# Patient Record
Sex: Female | Born: 1970 | Hispanic: Yes | Marital: Married | Smoking: Never smoker
Health system: Southern US, Community
[De-identification: ages and names within clinical notes are randomized; demographics above are authoritative.]

## PROBLEM LIST (undated history)

## (undated) DIAGNOSIS — Z227 Latent tuberculosis: Secondary | ICD-10-CM

## (undated) DIAGNOSIS — K5090 Crohn's disease, unspecified, without complications: Secondary | ICD-10-CM

## (undated) HISTORY — PX: COLONOSCOPY: SHX174

## (undated) HISTORY — DX: Crohn's disease, unspecified, without complications: K50.90

## (undated) HISTORY — DX: Latent tuberculosis: Z22.7

---
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
## 2020-03-06 ENCOUNTER — Other Ambulatory Visit: Payer: Self-pay

## 2020-03-06 ENCOUNTER — Encounter: Payer: Self-pay | Admitting: Emergency Medicine

## 2020-03-06 ENCOUNTER — Encounter: Payer: Self-pay | Admitting: Gastroenterology

## 2020-03-06 ENCOUNTER — Ambulatory Visit (INDEPENDENT_AMBULATORY_CARE_PROVIDER_SITE_OTHER): Payer: 59 | Admitting: Emergency Medicine

## 2020-03-06 VITALS — BP 119/79 | HR 77 | Temp 98.4°F | Resp 16 | Ht 65.0 in | Wt 136.0 lb

## 2020-03-06 DIAGNOSIS — Z23 Encounter for immunization: Secondary | ICD-10-CM | POA: Diagnosis not present

## 2020-03-06 DIAGNOSIS — Z7689 Persons encountering health services in other specified circumstances: Secondary | ICD-10-CM

## 2020-03-06 DIAGNOSIS — K839 Disease of biliary tract, unspecified: Secondary | ICD-10-CM | POA: Diagnosis not present

## 2020-03-06 NOTE — Patient Instructions (Addendum)
   If you have lab work done today you will be contacted with your lab results within the next 2 weeks.  If you have not heard from us then please contact us. The fastest way to get your results is to register for My Chart.   IF you received an x-ray today, you will receive an invoice from [facility]. Please contact [facility] at [phone] with questions or concerns regarding your invoice.   IF you received labwork today, you will receive an invoice from [facility]. Please contact [facility] at [phone] with questions or concerns regarding your invoice.   Our billing staff will not be able to assist you with questions regarding bills from these companies.  You will be contacted with the lab results as soon as they are available. The fastest way to get your results is to activate your My Chart account. Instructions are located on the last page of this paperwork. If you have not heard from us regarding the results in 2 weeks, please contact this office.     Mantenimiento de la salud en las mujeres Health Maintenance, Female Adoptar un estilo de vida saludable y recibir atencin preventiva son importantes para promover la salud y el bienestar. Consulte al mdico sobre:  El esquema adecuado para hacerse pruebas y exmenes peridicos.  Cosas que puede hacer por su cuenta para prevenir enfermedades y mantenerse sana. Qu debo saber sobre la dieta, el peso y el ejercicio? Consuma una dieta saludable   Consuma una dieta que incluya muchas verduras, frutas, productos lcteos con bajo contenido de grasa y protenas magras.  No consuma muchos alimentos ricos en grasas slidas, azcares agregados o sodio. Mantenga un peso saludable El ndice de masa muscular (IMC) se utiliza para identificar problemas de peso. Proporciona una estimacin de la grasa corporal basndose en el peso y la altura. Su mdico puede ayudarle a determinar su IMC y a lograr o mantener un peso  saludable. Haga ejercicio con regularidad Haga ejercicio con regularidad. Esta es una de las prcticas ms importantes que puede hacer por su salud. La mayora de los adultos deben seguir estas pautas:  Realizar, al menos, 150minutos de actividad fsica por semana. El ejercicio debe aumentar la frecuencia cardaca y hacerlo transpirar (ejercicio de intensidad moderada).  Hacer ejercicios de fortalecimiento por lo menos dos veces por semana. Agregue esto a su plan de ejercicio de intensidad moderada.  Pasar menos tiempo sentados. Incluso la actividad fsica ligera puede ser beneficiosa. Controle sus niveles de colesterol y lpidos en la sangre Comience a realizarse anlisis de lpidos y colesterol en la sangre a los 20aos y luego reptalos cada 5aos. Hgase controlar los niveles de colesterol con mayor frecuencia si:  Sus niveles de lpidos y colesterol son altos.  Es mayor de 40aos.  Presenta un alto riesgo de padecer enfermedades cardacas. Qu debo saber sobre las pruebas de deteccin del cncer? Segn su historia clnica y sus antecedentes familiares, es posible que deba realizarse pruebas de deteccin del cncer en diferentes edades. Esto puede incluir pruebas de deteccin de lo siguiente:  Cncer de mama.  Cncer de cuello uterino.  Cncer colorrectal.  Cncer de piel.  Cncer de pulmn. Qu debo saber sobre la enfermedad cardaca, la diabetes y la hipertensin arterial? Presin arterial y enfermedad cardaca  La hipertensin arterial causa enfermedades cardacas y aumenta el riesgo de accidente cerebrovascular. Es ms probable que esto se manifieste en las personas que tienen lecturas de presin arterial alta, tienen ascendencia africana o   tienen sobrepeso.  Hgase controlar la presin arterial: ? Cada 3 a 5 aos si tiene entre 18 y 39 aos. ? Todos los aos si es mayor de 40aos. Diabetes Realcese exmenes de deteccin de la diabetes con regularidad. Este  anlisis revisa el nivel de azcar en la sangre en ayunas. Hgase las pruebas de deteccin:  Cada tresaos despus de los 40aos de edad si tiene un peso normal y un bajo riesgo de padecer diabetes.  Con ms frecuencia y a partir de una edad inferior si tiene sobrepeso o un alto riesgo de padecer diabetes. Qu debo saber sobre la prevencin de infecciones? Hepatitis B Si tiene un riesgo ms alto de contraer hepatitis B, debe someterse a un examen de deteccin de este virus. Hable con el mdico para averiguar si tiene riesgo de contraer la infeccin por hepatitis B. Hepatitis C Se recomienda el anlisis a:  Todos los que nacieron entre 1945 y 1965.  Todas las personas que tengan un riesgo de haber contrado hepatitis C. Enfermedades de transmisin sexual (ETS)  Hgase las pruebas de deteccin de ITS, incluidas la gonorrea y la clamidia, si: ? Es sexualmente activa y es menor de 24aos. ? Es mayor de 24aos, y el mdico le informa que corre riesgo de tener este tipo de infecciones. ? La actividad sexual ha cambiado desde que le hicieron la ltima prueba de deteccin y tiene un riesgo mayor de tener clamidia o gonorrea. Pregntele al mdico si usted tiene riesgo.  Pregntele al mdico si usted tiene un alto riesgo de contraer VIH. El mdico tambin puede recomendarle un medicamento recetado para ayudar a evitar la infeccin por el VIH. Si elige tomar medicamentos para prevenir el VIH, primero debe hacerse los anlisis de deteccin del VIH. Luego debe hacerse anlisis cada 3meses mientras est tomando los medicamentos. Embarazo  Si est por dejar de menstruar (fase premenopusica) y usted puede quedar embarazada, busque asesoramiento antes de quedar embarazada.  Tome de 400 a 800microgramos (mcg) de cido flico todos los das si queda embarazada.  Pida mtodos de control de la natalidad (anticonceptivos) si desea evitar un embarazo no deseado. Osteoporosis y menopausia La  osteoporosis es una enfermedad en la que los huesos pierden los minerales y la fuerza por el avance de la edad. El resultado pueden ser fracturas en los huesos. Si tiene 65aos o ms, o si est en riesgo de sufrir osteoporosis y fracturas, pregunte a su mdico si debe:  Hacerse pruebas de deteccin de prdida sea.  Tomar un suplemento de calcio o de vitamina D para reducir el riesgo de fracturas.  Recibir terapia de reemplazo hormonal (TRH) para tratar los sntomas de la menopausia. Siga estas instrucciones en su casa: Estilo de vida  No consuma ningn producto que contenga nicotina o tabaco, como cigarrillos, cigarrillos electrnicos y tabaco de mascar. Si necesita ayuda para dejar de fumar, consulte al mdico.  No consuma drogas.  No comparta agujas.  Solicite ayuda a su mdico si necesita apoyo o informacin para abandonar las drogas. Consumo de alcohol  No beba alcohol si: ? Su mdico le indica no hacerlo. ? Est embarazada, puede estar embarazada o est tratando de quedar embarazada.  Si bebe alcohol: ? Limite la cantidad que consume de 0 a 1 medida por da. ? Limite la ingesta si est amamantando.  Est atento a la cantidad de alcohol que hay en las bebidas que toma. En los [place], una medida equivale a una botella de cerveza de 12oz (355ml),   un vaso de vino de 5oz (148ml) o un vaso de una bebida alcohlica de alta graduacin de 1oz (44ml). Instrucciones generales  Realcese los estudios de rutina de la salud, dentales y de la vista.  Mantngase al da con las vacunas.  Infrmele a su mdico si: ? Se siente deprimida con frecuencia. ? Alguna vez ha sido vctima de maltrato o no se siente segura en su casa. Resumen  Adoptar un estilo de vida saludable y recibir atencin preventiva son importantes para promover la salud y el bienestar.  Siga las instrucciones del mdico acerca de una dieta saludable, el ejercicio y la realizacin de pruebas o exmenes  para detectar enfermedades.  Siga las instrucciones del mdico con respecto al control del colesterol y la presin arterial. Esta informacin no tiene como fin reemplazar el consejo del mdico. Asegrese de hacerle al mdico cualquier pregunta que tenga. Document Revised: 09/20/2018 Document Reviewed: 09/20/2018 Elsevier Patient Education  2020 Elsevier Inc.  

## 2020-03-06 NOTE — Progress Notes (Signed)
Mackenzie Floyd 49 y.o.   Chief Complaint  Patient presents with  . Establish Care    per patient to discuss CT scan of abd she had results, per patient    HISTORY OF PRESENT ILLNESS: This is a 49 y.o. female first visit to this office here to establish care with me. CT scan of abdomen and pelvis dated 10/03/2018 done at [facility] in [facility] showed intrahepatic and extrahepatic biliary dilation.  Patient had been complaining of burning sensation to right upper quadrant along with weight loss.  Was seen once by gastrointestinal doctor who recommended ERCP.  No follow-up visit was scheduled due to pandemic.  Patient new to this area.  Needs GI referral. Gets occasional burning sensation to right upper quadrant, otherwise asymptomatic. Past medical history includes positive ANA and low platelets.  Has never been diagnosed with lupus or any other condition. Does not want to do blood work today. Fully vaccinated against Covid.  HPI   Prior to Admission medications   Medication Sig Start Date End Date Taking? Authorizing Provider  VITAMIN D PO Take 2,000 mg by mouth daily.   Yes [provider]  Magnesium Hydroxide (MAGNESIA PO) Take by mouth daily.    [provider]    No Known Allergies  There are no problems to display for this patient.   History reviewed. No pertinent past medical history.  Past Surgical History:  Procedure Laterality Date  . CESAREAN SECTION N/A    Phreesia 03/04/2020    Social History   Socioeconomic History  . Marital status: Married    Spouse name: Not on file  . Number of children: Not on file  . Years of education: Not on file  . Highest education level: Not on file  Occupational History  . Not on file  Tobacco Use  . Smoking status: Never Smoker  . Smokeless tobacco: Never Used  Substance and Sexual Activity  . Alcohol use: Not on file    Comment: socially beer and wine  . Drug use: Never  .  Sexual activity: Not on file  Other Topics Concern  . Not on file  Social History Narrative  . Not on file   Social Determinants of Health   Financial Resource Strain:   . Difficulty of Paying Living Expenses:   Food Insecurity:   . Worried About Charity fundraiser in the Last Year:   . Arboriculturist in the Last Year:   Transportation Needs:   . Film/video editor (Medical):   Marland Kitchen Lack of Transportation (Non-Medical):   Physical Activity:   . Days of Exercise per Week:   . Minutes of Exercise per Session:   Stress:   . Feeling of Stress :   Social Connections:   . Frequency of Communication with Friends and Family:   . Frequency of Social Gatherings with Friends and Family:   . Attends Religious Services:   . Active Member of Clubs or Organizations:   . Attends Archivist Meetings:   Marland Kitchen Marital Status:   Intimate Partner Violence:   . Fear of Current or Ex-Partner:   . Emotionally Abused:   Marland Kitchen Physically Abused:   . Sexually Abused:     History reviewed. No pertinent family history.   Review of Systems  Constitutional: Negative.  Negative for chills and fever.  HENT: Negative.  Negative for congestion and sore throat.   Respiratory: Negative.  Negative for cough and shortness of breath.  Cardiovascular: Negative.  Negative for chest pain and palpitations.  Gastrointestinal: Negative.  Negative for abdominal pain, blood in stool, constipation, diarrhea, melena, nausea and vomiting.  Genitourinary: Negative.  Negative for dysuria and hematuria.  Musculoskeletal: Negative.  Negative for back pain, myalgias and neck pain.  Skin: Negative.  Negative for rash.  Neurological: Negative.  Negative for dizziness and headaches.  Endo/Heme/Allergies: Negative.   All other systems reviewed and are negative.  Vitals:   03/06/20 0918  BP: 119/79  Pulse: 77  Resp: 16  Temp: 98.4 F (36.9 C)  SpO2: 100%     Physical Exam Vitals reviewed.  Constitutional:        Appearance: Normal appearance.  HENT:     Head: Normocephalic.  Eyes:     Extraocular Movements: Extraocular movements intact.  Cardiovascular:     Rate and Rhythm: Normal rate.  Pulmonary:     Effort: Pulmonary effort is normal.  Musculoskeletal:        General: Normal range of motion.     Cervical back: Normal range of motion.  Neurological:     General: No focal deficit present.     Mental Status: She is alert and oriented to person, place, and time.  Psychiatric:        Mood and Affect: Mood normal.        Behavior: Behavior normal.      ASSESSMENT & PLAN: Mackenzie Floyd was seen today for establish care.  Diagnoses and all orders for this visit:  Encounter to establish care  Biliary tract disease -     Ambulatory referral to Gastroenterology  Need for diphtheria-tetanus-pertussis (Tdap) vaccine -     Tdap vaccine greater than or equal to 7yo IM    Patient Instructions       If you have lab work done today you will be contacted with your lab results within the next 2 weeks.  If you have not heard from [place] then please contact us. The fastest way to get your results is to register for My Chart.   IF you received an x-ray today, you will receive an invoice from [facility]. Please contact [facility] at [phone] with questions or concerns regarding your invoice.   IF you received labwork today, you will receive an invoice from [facility]. Please contact [facility] at [phone] with questions or concerns regarding your invoice.   Our billing staff will not be able to assist you with questions regarding bills from these companies.  You will be contacted with the lab results as soon as they are available. The fastest way to get your results is to activate your My Chart account. Instructions are located on the last page of this paperwork. If you have not heard from [place] regarding the results in 2 weeks, please contact this office.     Mantenimiento de  Technical sales engineer en [place] Maintenance, Female Adoptar un estilo de vida saludable y recibir atencin preventiva son importantes para promover la salud y Musician. Consulte al mdico sobre:  El esquema adecuado para hacerse pruebas y exmenes peridicos.  Cosas que puede hacer por su cuenta para prevenir enfermedades y [employer]. Qu debo saber sobre la dieta, el peso y el ejercicio? Consuma una dieta saludable   Consuma una dieta que incluya muchas verduras, frutas, productos lcteos con bajo contenido de [place] y Advertising account planner.  No consuma muchos alimentos ricos en grasas slidas, azcares agregados o sodio. Mantenga un peso saludable El ndice de masa  muscular [facility]) se [place] para identificar problemas de peso. Proporciona una estimacin de la grasa corporal basndose en el peso y la altura. Su mdico puede ayudarle a Radiation protection practitioner [place] y a Scientist, forensic o Theatre manager un peso saludable. Haga ejercicio con regularidad Haga ejercicio con regularidad. Esta es una de las prcticas ms importantes que puede hacer por su salud. La mayora de los adultos deben seguir estas pautas:  Optometrist, al menos, 131minutos de actividad fsica por semana. El ejercicio debe aumentar la frecuencia cardaca y Nature conservation officer transpirar (ejercicio de intensidad moderada).  Hacer ejercicios de fortalecimiento por lo [employer] por semana. Agregue esto a su plan de ejercicio de intensidad moderada.  Pasar menos tiempo sentados. Incluso la actividad fsica ligera puede ser beneficiosa. Controle sus niveles de colesterol y lpidos en la sangre Comience a realizarse anlisis de lpidos y Research officer, trade union en la sangre a los 20aos y luego reptalos cada 5aos. Hgase controlar los niveles de colesterol con mayor frecuencia si:  Sus niveles de lpidos y colesterol son altos.  Es mayor de 40aos.  Presenta un alto riesgo de padecer enfermedades cardacas. Qu debo saber sobre las pruebas de deteccin del  cncer? Segn su historia clnica y sus antecedentes familiares, es posible que deba realizarse pruebas de deteccin del cncer en diferentes edades. Esto puede incluir pruebas de deteccin de lo siguiente:  Cncer de mama.  Cncer de cuello uterino.  Cncer colorrectal.  Cncer de piel.  Cncer de pulmn. Qu debo saber sobre la enfermedad cardaca, la diabetes y la hipertensin arterial? Presin arterial y enfermedad cardaca  La hipertensin arterial causa enfermedades cardacas y [place] el riesgo de accidente cerebrovascular. Es ms probable que esto se manifieste en las personas que tienen lecturas de presin arterial alta, tienen ascendencia africana o tienen sobrepeso.  Hgase controlar la presin arterial: ? Cada 3 a 5 aos si tiene entre 18 y 65 aos. ? Todos los aos si es mayor de [place]. Diabetes Realcese exmenes de deteccin de la diabetes con regularidad. Este anlisis revisa el nivel de azcar en la sangre en [place]. Hgase las pruebas de deteccin:  Cada tresaos despus de los 76aos de edad si tiene un peso normal y un bajo riesgo de padecer diabetes.  Con ms frecuencia y a partir de [place] edad inferior si tiene sobrepeso o un alto riesgo de padecer diabetes. Qu debo saber sobre la prevencin de infecciones? Hepatitis B Si tiene un riesgo ms alto de contraer hepatitis B, debe someterse a un examen de deteccin de este virus. Hable con el mdico para averiguar si tiene riesgo de contraer la infeccin por hepatitis B. Hepatitis C Se recomienda el anlisis a:  [employer] 1945 y 1965.  Todas las personas que tengan un riesgo de haber contrado hepatitis C. Enfermedades de transmisin sexual (ETS)  Hgase las pruebas de Programme researcher, broadcasting/film/video de ITS, incluidas la gonorrea y la clamidia, si: ? Es sexualmente activa y es menor de [place]. ? Es mayor de 24aos, y Investment banker, operational informa que corre riesgo de tener este tipo de infecciones. ? La actividad  sexual ha cambiado desde que le hicieron la ltima prueba de deteccin y tiene un riesgo mayor de Best boy clamidia o Radio broadcast assistant. Pregntele al mdico si usted tiene riesgo.  Pregntele al mdico si usted tiene un alto riesgo de Museum/gallery curator VIH. El mdico tambin puede recomendarle un medicamento recetado para ayudar a evitar la infeccin por el VIH. Si elige tomar medicamentos para prevenir el VIH, primero debe  hacerse los [employer] de deteccin del VIH. Luego debe hacerse anlisis cada 84meses mientras est tomando los medicamentos. Embarazo  Si est por dejar de Librarian, academic (fase premenopusica) y usted puede quedar [place], busque asesoramiento antes de [place].  Tome de 400 a [id number] (mcg) de cido [employer] si [place].  Pida mtodos de control de la natalidad (anticonceptivos) si desea evitar un embarazo no deseado. Osteoporosis y [place] La osteoporosis es una enfermedad en la que los huesos pierden los minerales y la fuerza por el avance de la edad. El resultado pueden ser fracturas en los [place]. Si tiene 65aos o ms, o si est en riesgo de sufrir osteoporosis y fracturas, pregunte a su mdico si debe:  Hacerse pruebas de deteccin de prdida sea.  Tomar un suplemento de calcio o de vitamina D para reducir el riesgo de fracturas.  Recibir terapia de reemplazo hormonal (TRH) para tratar los sntomas de la menopausia. Siga estas instrucciones en su casa: Estilo de vida  No consuma ningn producto que contenga nicotina o tabaco, como cigarrillos, cigarrillos electrnicos y tabaco de Higher education careers adviser. Si necesita ayuda para dejar de fumar, consulte al mdico.  No consuma drogas.  No comparta agujas.  Solicite ayuda a su mdico si necesita apoyo o informacin para abandonar las drogas. Consumo de alcohol  No beba alcohol si: ? Su mdico le indica no hacerlo. ? Est embarazada, puede estar embarazada o est tratando de quedar embarazada.  Si bebe  alcohol: ? Limite la cantidad que consume de 0 a 1 medida por da. ? Limite la ingesta si est amamantando.  Est atento a la cantidad de alcohol que hay en las bebidas que toma. En los [place], una medida equivale a una botella de cerveza de 12oz (372ml), un vaso de vino de 5oz (111ml) o un vaso de una bebida alcohlica de alta graduacin de 1oz (20ml). Instrucciones generales  Realcese los estudios de rutina de la salud, dentales y de Public librarian.  [place].  Infrmele a su mdico si: ? Se siente deprimida con frecuencia. ? Alguna vez ha sido vctima de [place] o no se siente segura en su casa. Resumen  Adoptar un estilo de vida saludable y recibir atencin preventiva son importantes para promover la salud y Musician.  Siga las instrucciones del mdico acerca de una dieta saludable, el ejercicio y la realizacin de pruebas o exmenes para Engineer, building services.  Siga las instrucciones del mdico con respecto al control del colesterol y la presin arterial. Esta informacin no tiene Marine scientist el consejo del mdico. Asegrese de hacerle al mdico cualquier pregunta que tenga. Document Revised: 09/20/2018 Document Reviewed: 09/20/2018 Elsevier Patient Education  2020 Elsevier Inc.      Agustina Caroli, MD [facility]

## 2020-03-07 ENCOUNTER — Encounter: Payer: Self-pay | Admitting: Gastroenterology

## 2020-03-11 ENCOUNTER — Other Ambulatory Visit (INDEPENDENT_AMBULATORY_CARE_PROVIDER_SITE_OTHER): Payer: 59

## 2020-03-11 ENCOUNTER — Telehealth: Payer: Self-pay | Admitting: Gastroenterology

## 2020-03-11 ENCOUNTER — Encounter: Payer: Self-pay | Admitting: Gastroenterology

## 2020-03-11 ENCOUNTER — Other Ambulatory Visit: Payer: Self-pay

## 2020-03-11 ENCOUNTER — Ambulatory Visit (INDEPENDENT_AMBULATORY_CARE_PROVIDER_SITE_OTHER): Payer: 59 | Admitting: Gastroenterology

## 2020-03-11 VITALS — BP 112/78 | HR 68 | Ht 65.0 in | Wt 138.0 lb

## 2020-03-11 DIAGNOSIS — R933 Abnormal findings on diagnostic imaging of other parts of digestive tract: Secondary | ICD-10-CM

## 2020-03-11 DIAGNOSIS — Z1211 Encounter for screening for malignant neoplasm of colon: Secondary | ICD-10-CM | POA: Diagnosis not present

## 2020-03-11 DIAGNOSIS — R1011 Right upper quadrant pain: Secondary | ICD-10-CM

## 2020-03-11 LAB — COMPREHENSIVE METABOLIC PANEL
ALT: 9 U/L (ref 0–35)
AST: 16 U/L (ref 0–37)
Albumin: 4.3 g/dL (ref 3.5–5.2)
Alkaline Phosphatase: 65 U/L (ref 39–117)
BUN: 8 mg/dL (ref 6–23)
CO2: 30 mEq/L (ref 19–32)
Calcium: 9.6 mg/dL (ref 8.4–10.5)
Chloride: 101 mEq/L (ref 96–112)
Creatinine, Ser: 0.81 mg/dL (ref 0.40–1.20)
GFR: 75.19 mL/min (ref >60.00)
Glucose, Bld: 91 mg/dL (ref 70–99)
Potassium: 4 mEq/L (ref 3.5–5.1)
Sodium: 136 mEq/L (ref 135–145)
Total Bilirubin: 0.9 mg/dL (ref 0.2–1.2)
Total Protein: 7.4 g/dL (ref 6.0–8.3)

## 2020-03-11 LAB — CBC WITH DIFFERENTIAL/PLATELET
Basophils Absolute: 0.1 10*3/uL (ref 0.0–0.1)
Basophils Relative: 1.3 % (ref 0.0–3.0)
Eosinophils Absolute: 0.2 10*3/uL (ref 0.0–0.7)
Eosinophils Relative: 3.8 % (ref 0.0–5.0)
HCT: 38.5 % (ref 36.0–46.0)
Hemoglobin: 12.9 g/dL (ref 12.0–15.0)
Lymphocytes Relative: 20.7 % (ref 12.0–46.0)
Lymphs Abs: 1.1 10*3/uL (ref 0.7–4.0)
MCHC: 33.5 g/dL (ref 30.0–36.0)
MCV: 83.4 fl (ref 78.0–100.0)
Monocytes Absolute: 0.4 10*3/uL (ref 0.1–1.0)
Monocytes Relative: 7.5 % (ref 3.0–12.0)
Neutro Abs: 3.6 10*3/uL (ref 1.4–7.7)
Neutrophils Relative %: 66.7 % (ref 43.0–77.0)
Platelets: 141 10*3/uL — ABNORMAL LOW (ref 150.0–400.0)
RBC: 4.61 Mil/uL (ref 3.87–5.11)
RDW: 14.2 % (ref 11.5–15.5)
WBC: 5.4 10*3/uL (ref 4.0–10.5)

## 2020-03-11 LAB — H. PYLORI ANTIBODY, IGG: H Pylori IgG: NEGATIVE

## 2020-03-11 NOTE — Patient Instructions (Signed)
If you are age 49 or older, your body mass index should be between 23-30. Your Body mass index is 22.96 kg/m. If this is out of the aforementioned range listed, please consider follow up with your Primary Care Provider.  If you are age 50 or younger, your body mass index should be between 19-25. Your Body mass index is 22.96 kg/m. If this is out of the aformentioned range listed, please consider follow up with your Primary Care Provider.   You have been scheduled for an MRCP at [facility] through the [facility]. They will direct you to radiology.. Your appointment is scheduled on Tuesday, 03-25-20 at 7:00am. Please arrive 30 minutes prior to your appointment time for registration purposes. Please make certain not to have anything to eat or drink 6 hours prior to your test. In addition, if you have any metal in your body, have a pacemaker or defibrillator, please be sure to let your ordering physician know. This test typically takes 45 minutes to 1 hour to complete. Should you need to reschedule, please call [phone].  _____________________________________________________________________  Please go to the lab in the basement of our building to have lab work done as you leave today. Hit "B" for basement when you get on the elevator.  When the doors open the lab is on your left.  We will call you with the results. Thank you.  Due to recent changes in healthcare laws, you may see the results of your imaging and laboratory studies on MyChart before your provider has had a chance to review them.  We understand that in some cases there may be results that are confusing or concerning to you. Not all laboratory results come back in the same time frame and the provider may be waiting for multiple results in order to interpret others.  Please give [place] 48 hours in order for your provider to thoroughly review all the results before contacting the office for clarification of  your results.    Thank you for entrusting me with your care and for choosing [facility], Dr.  Cellar

## 2020-03-11 NOTE — Progress Notes (Signed)
HPI :  49 year old healthy female without any significant past medical history, referred by Drenda Freeze MD for abdominal discomfort and history of abnormal CT scan.  Patient states back in 2018 she developed some right upper quadrant discomfort when she lived in [place].  She was told potentially she had a gallbladder problem but had an ultrasound that was reportedly okay.  Apparently over time her symptoms came back and she had a CT scan done in January as outlined below.  Remarkable findings are intra and extrahepatic biliary ductal dilation which appears mild, benign cyst in the liver, nondistended gallbladder without gallstones.  She was told she might need a follow-up MRI or ERCP.  She has not followed up with GI since that timeframe.  She has since moved to [place].  She has an occasional burning sensation in her right upper quadrant which does not really radiate anywhere else.  She will feel this to some extent every day, often feels it after she eats something, usually feels it multiple times a day and then will go away after short period of time.  She denies any reflux or dysphagia.  No nausea or vomiting.  She had some problems in January with constipation, has been using some magnesium supplementation and that has resolved this issue.  She denies any blood in her stools.  She denies any significant past medical history.  No family history of liver disease.  Her father had some colon polyps removed in the past but no family history of colon cancer.  She is never had a prior colonoscopy.  No prior upper endoscopy.  It does not appear that she is tried antacid for her upper abdominal discomfort.  She does not drink much of any alcohol.  CT scan 10/03/18 - [facility] in [place] - intrahepatic / extrahepatic ductal dilation - 2.9 x 2.7cm low density lesion in the liver most c/w cyst, gallbladder nondistended, degenerative changes in the SI joint vs. Sacroileitis.     No past medical history on file.   Past Surgical History:  Procedure Laterality Date   CESAREAN SECTION N/A 1997 and 2008   [employer] 03/04/2020   Family History  Problem Relation Age of Onset   Stroke Father    Hypertension Father    Social History   Tobacco Use   Smoking status: Never Smoker   Smokeless tobacco: Never Used  Substance Use Topics   Alcohol use: Yes    Comment: socially beer and wine   Drug use: Never   Current Outpatient Medications  Medication Sig Dispense Refill   Magnesium Hydroxide (MAGNESIA PO) Take by mouth daily.     VITAMIN D PO Take 2,000 mg by mouth daily.     No current facility-administered medications for this visit.   No Known Allergies   Review of Systems: All systems reviewed and negative except where noted in HPI.   No labs on file   Physical Exam: BP 112/78    Pulse 68    Ht 5\' 5"  (1.651 m)    Wt 138 lb (62.6 kg)    LMP 03/04/2020 (Exact Date)    Breastfeeding Unknown    BMI 22.96 kg/m  Constitutional: Pleasant,well-developed,female in no acute distress. HEENT: Normocephalic and atraumatic. Conjunctivae are normal. No scleral icterus. Neck supple.  Cardiovascular: Normal rate, regular rhythm.  Pulmonary/chest: Effort normal and breath sounds normal. No wheezing, rales or rhonchi. Abdominal: Soft, nondistended, nontender. There are no masses palpable. No hepatomegaly. Extremities: no edema Lymphadenopathy:  No cervical adenopathy noted. Neurological: Alert and oriented to person place and time. Skin: Skin is warm and dry. No rashes noted. Psychiatric: Normal mood and affect. Behavior is normal.   ASSESSMENT AND PLAN: 49 year old female here for new patient assessment the following:  Abnormal imaging of the biliary tree / right upper quadrant discomfort - sounds like intermittent symptoms for a long time, CT scan in January showed some nonspecific mild biliary ductal dilation.  I do not have any baseline labs on  file for her.  I discussed differential for her CT imaging abnormalities and her symptoms.  Recommending LFTs today and will also check CBC.  Based on her CT scan findings I am recommending an MRCP to further evaluate her biliary tree.  That being said unclear if her symptoms are related to the biliary ductal dilation or not.  I will screen her for H pylori IgG and offer her a trial of PPI to see if this will help some of her postprandial symptoms.  Apparently prior ultrasound showed no evidence of gallstones.  She wishes to pursue MRCP and labs today, but symptoms are mild and she declines trial of PPI when offered.  She will consider this pending her course.  All questions answered she agreed with the plan.  Colon cancer screening - she is due for routine colon cancer screening.  Discussed colonoscopy and risk benefits of it.  She is willing to proceed with this at some point time but wishes to get her MRI done first and will call to schedule when she wants to proceed with colonoscopy.  [place] Cellar, MD [facility]  CC: Mitchel Honour Elkins

## 2020-03-11 NOTE — Progress Notes (Signed)
error 

## 2020-03-11 NOTE — Telephone Encounter (Signed)
Mackenzie Floyd called to let you know that they do not have the CT scan report it is from [place]. Please call her if you have any further questions.

## 2020-03-11 NOTE — Telephone Encounter (Signed)
CT scan requested and rec'd from [facility] in [place].

## 2020-03-25 ENCOUNTER — Other Ambulatory Visit: Payer: Self-pay | Admitting: Gastroenterology

## 2020-03-25 ENCOUNTER — Ambulatory Visit (HOSPITAL_COMMUNITY)
Admission: RE | Admit: 2020-03-25 | Discharge: 2020-03-25 | Disposition: A | Discharge status: Home / Self Care | Payer: 59 | Source: Ambulatory Visit | Attending: Gastroenterology | Admitting: Gastroenterology

## 2020-03-25 ENCOUNTER — Other Ambulatory Visit: Payer: Self-pay

## 2020-03-25 ENCOUNTER — Telehealth: Payer: Self-pay

## 2020-03-25 DIAGNOSIS — R933 Abnormal findings on diagnostic imaging of other parts of digestive tract: Secondary | ICD-10-CM | POA: Insufficient documentation

## 2020-03-25 DIAGNOSIS — R1011 Right upper quadrant pain: Secondary | ICD-10-CM | POA: Insufficient documentation

## 2020-03-25 IMAGING — MR MR ABDOMEN WO/W CM MRCP
18 of 21 series · 43 of 48 positions shown · IV contrast (gadavist)
Comparison: None.

CLINICAL DATA: Right upper quadrant pain. Outside CT demonstrating
biliary duct dilatation.

EXAM:
MRI ABDOMEN WITHOUT AND WITH CONTRAST (INCLUDING MRCP)
TECHNIQUE: Multiplanar multisequence MR imaging of the abdomen was performed
both before and after the administration of intravenous contrast.
Heavily T2-weighted images of the biliary and pancreatic ducts were
obtained, and three-dimensional MRCP images were rendered by post
processing.
CONTRAST:  6mL GADAVIST GADOBUTROL 1 MMOL/ML IV SOLN

[Series 3: T2 fat-sat · axial · 6.0mm · 1.25mm/px · 1 of 34 slices shown]
[im 1/34]
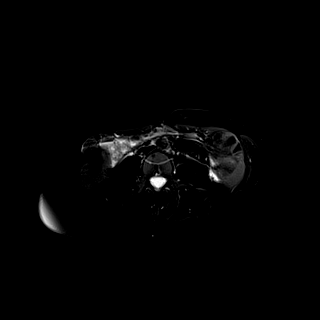

[Series 6: T2 · coronal · 6.0mm · 1.48mm/px · 1 of 25 slices shown (1 of 2)]
[im 1/25]
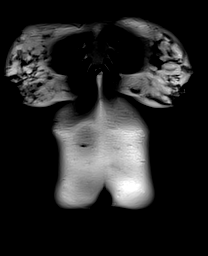

[Series 7: DWI · axial · 6.0mm · 1.49mm/px · z∈[-169,+83]mm · 3 of 72 slices shown (1 of 2)]
[im 1/72]
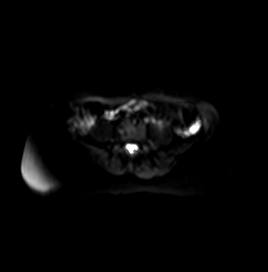
[im 36/72]
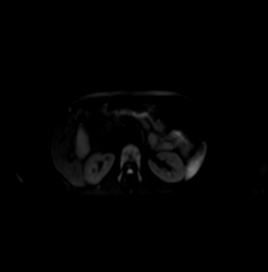
[im 72/72]
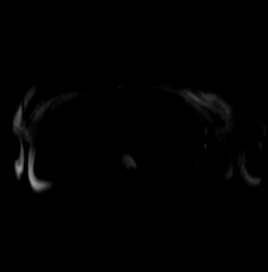

[Series 8: DWI · axial · 6.0mm · 1.49mm/px · 1 of 36 slices shown (2 of 2)]
[im 1/36]
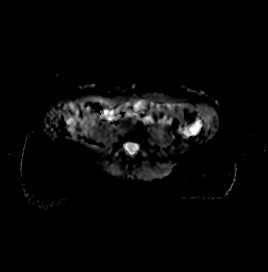

[Series 9: T1 · axial · 3.0mm · 1.12mm/px · z∈[-142,+71]mm · 3 of 72 slices shown (1 of 2)]
[im 1/72]
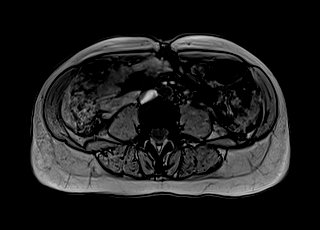
[im 36/72]
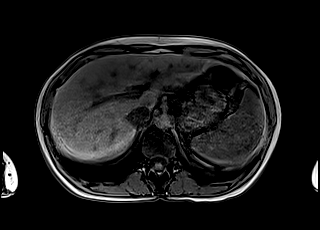
[im 72/72]
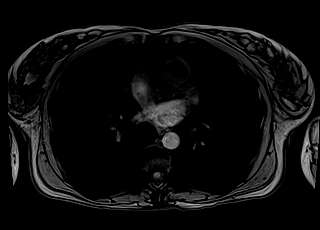

[Series 10: T1 · axial · 3.0mm · 1.12mm/px · z∈[-142,+71]mm · 3 of 72 slices shown (2 of 2)]
[im 1/72]
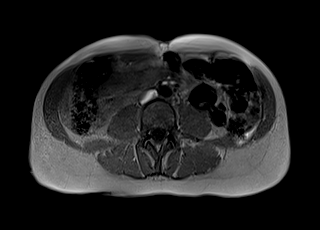
[im 36/72]
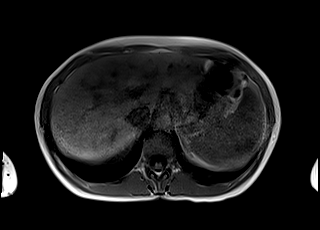
[im 72/72]
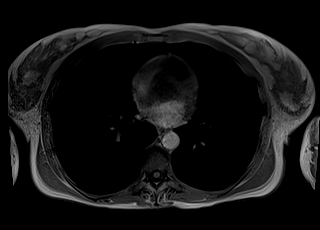

[Series 11: cor obl thk · sagittal · 50.0mm · 0.78mm/px · 1 of 9 slices shown]
[im 1/9]
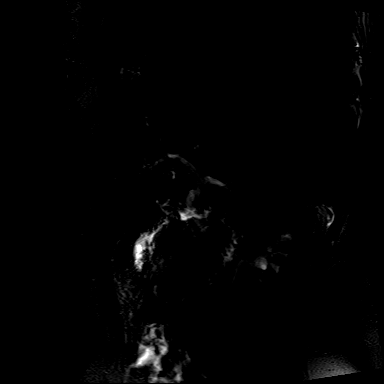

[Series 13: cor_3d_spc_trig · coronal · 1.0mm · 0.49mm/px · 3 of 88 slices shown]
[im 1/88]
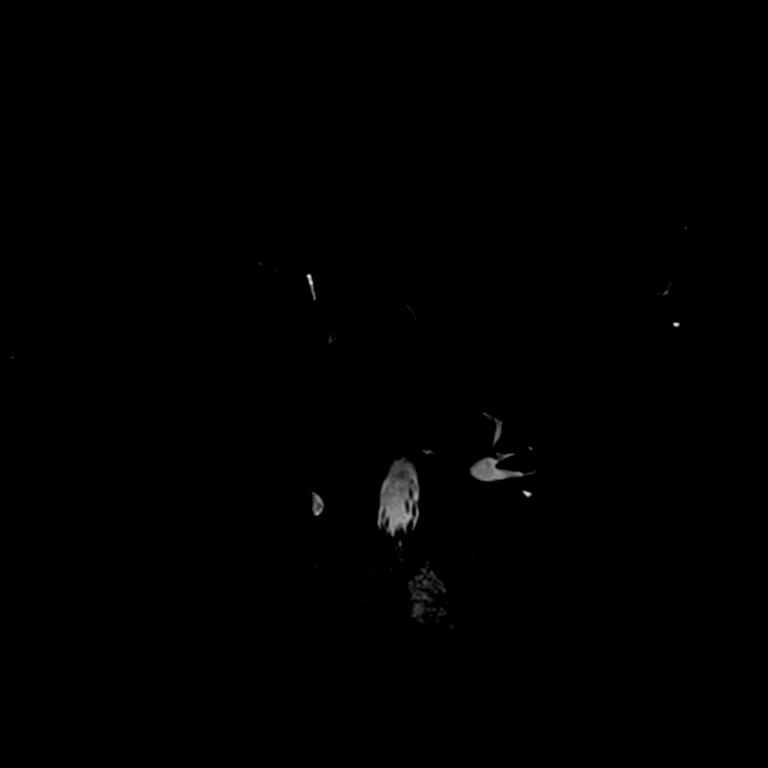
[im 44/88]
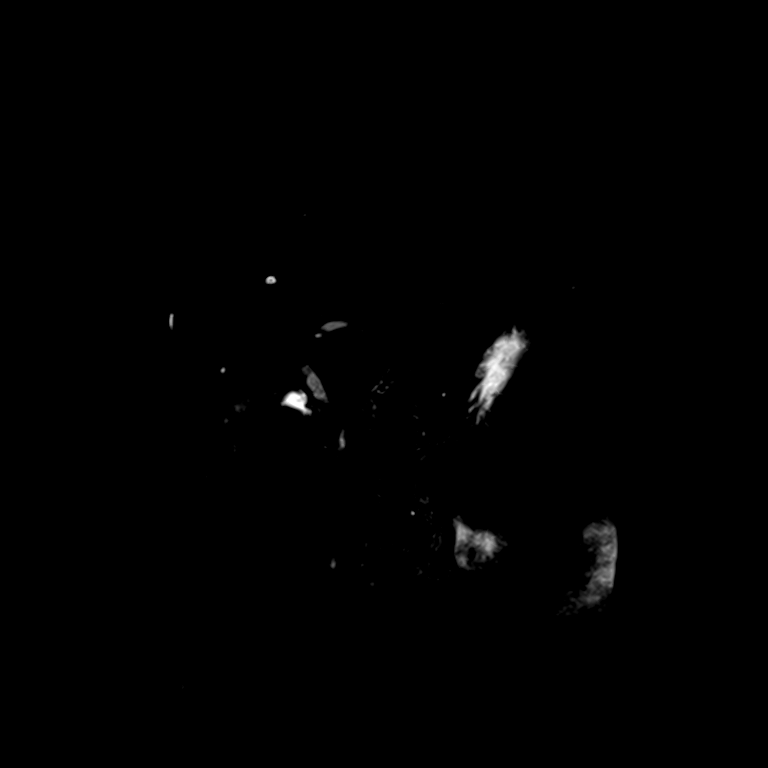
[im 88/88]
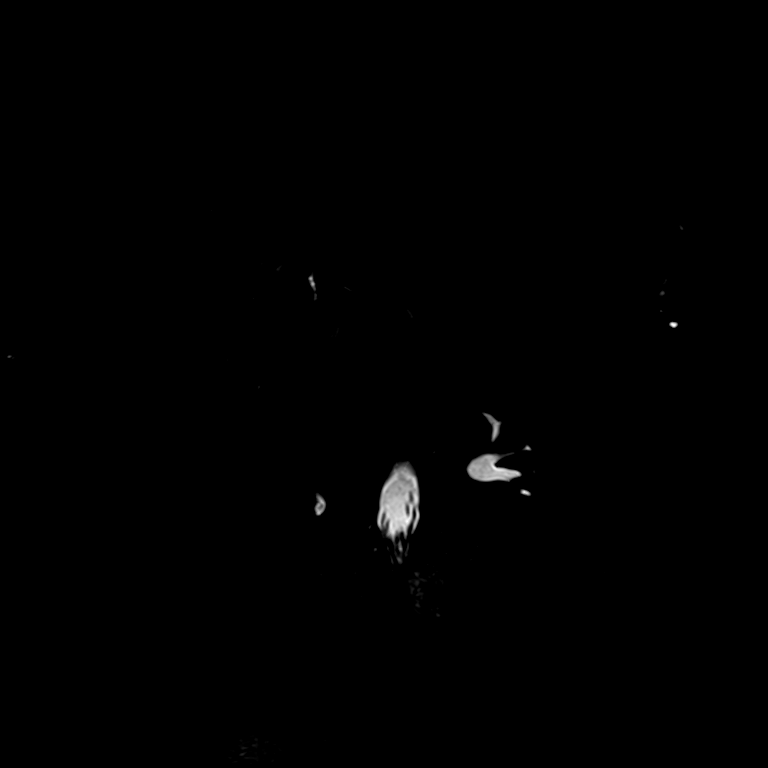

[Series 15: T2 · axial · 6.0mm · 1.41mm/px · 1 of 35 slices shown (2 of 2)]
[im 1/35]
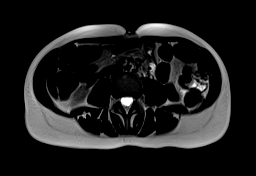

[Series 17: T1 dynamic · axial · 3.0mm · 1.12mm/px · z∈[-151,+86]mm · 3 of 80 slices shown (1 of 6)]
[im 1/80]
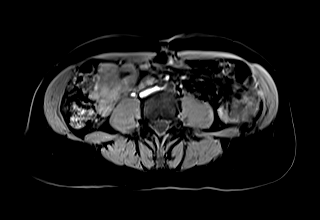
[im 40/80]
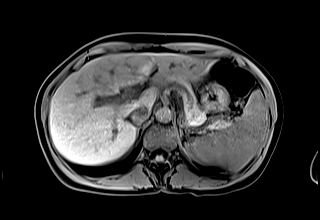
[im 80/80]
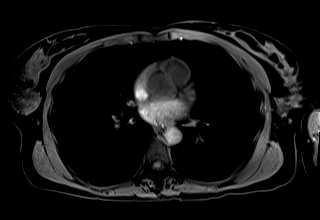

[Series 20: T1 dynamic · axial · 3.0mm · 1.12mm/px · z∈[-151,+86]mm · 3 of 80 slices shown (2 of 6)]
[im 1/80]
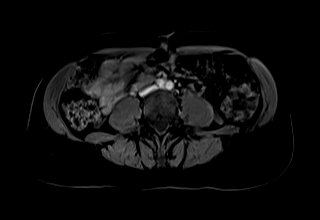
[im 40/80]
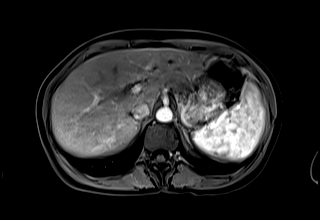
[im 80/80]
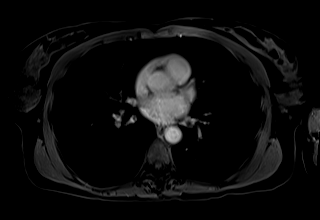

[Series 22: T1 dynamic · axial · 3.0mm · 1.12mm/px · z∈[-151,+86]mm · 3 of 80 slices shown (3 of 6)]
[im 1/80]
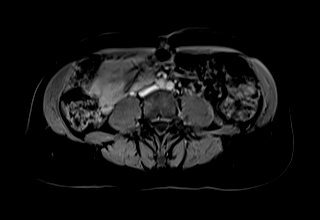
[im 40/80]
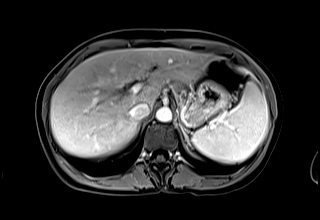
[im 80/80]
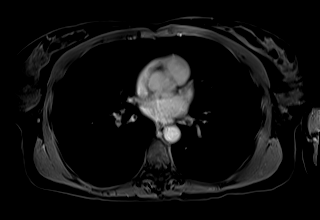

[Series 24: T1 dynamic · axial · 3.0mm · 1.12mm/px · z∈[-151,+86]mm · 3 of 80 slices shown (4 of 6)]
[im 1/80]
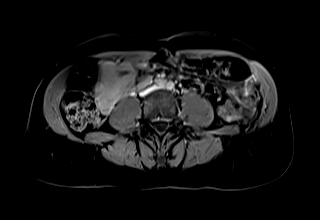
[im 40/80]
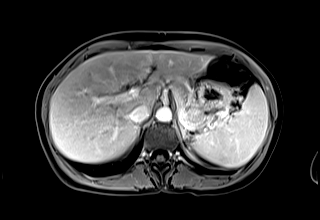
[im 80/80]
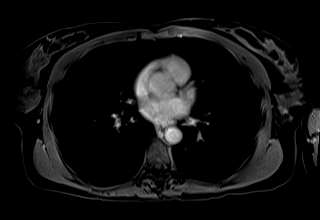

[Series 26: T1 dynamic · coronal · 3.0mm · 1.41mm/px · 2 of 60 slices shown (5 of 6)]
[im 1/60]
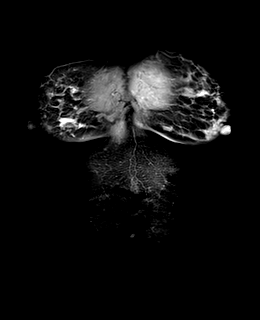
[im 60/60]
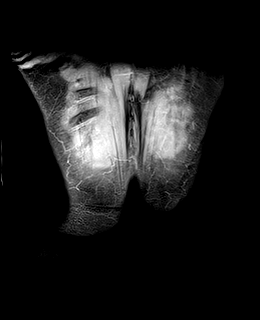

[Series 28: T1 dynamic · axial · 3.0mm · 1.12mm/px · z∈[-151,+86]mm · 3 of 80 slices shown (6 of 6)]
[im 1/80]
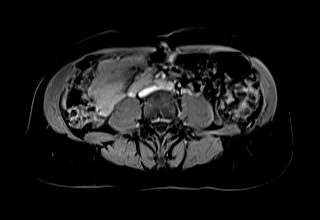
[im 40/80]
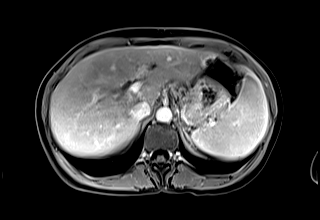
[im 80/80]
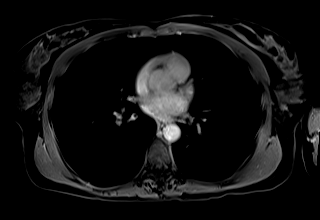

[Series 100: subtraction 20 sec · axial · 3.0mm · 1.12mm/px · z∈[-151,+86]mm · 3 of 80 slices shown]
[im 1/80]
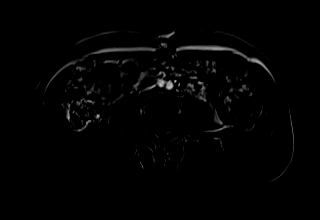
[im 40/80]
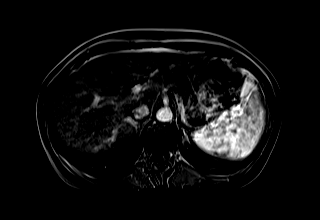
[im 80/80]
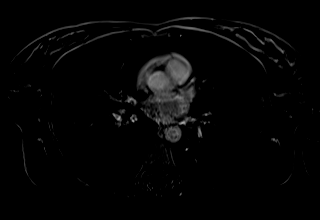

[Series 101: subtraction (id) · axial · 3.0mm · 1.12mm/px · z∈[-151,+86]mm · 3 of 80 slices shown]
[im 1/80]
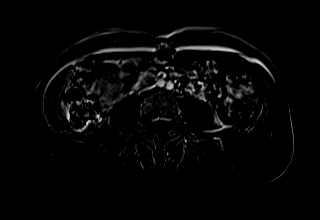
[im 40/80]
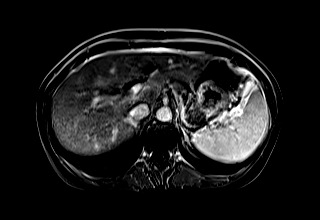
[im 80/80]
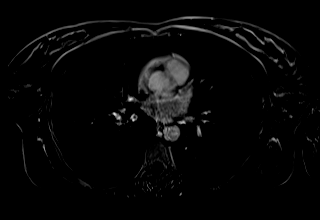

[Series 102: subtraction 90 sec · axial · 3.0mm · 1.12mm/px · z∈[-151,+86]mm · 3 of 80 slices shown]
[im 1/80]
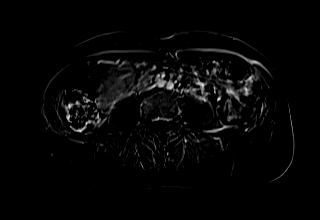
[im 40/80]
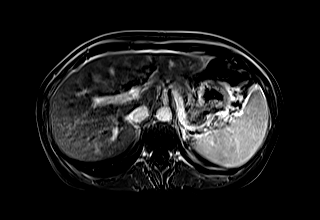
[im 80/80]
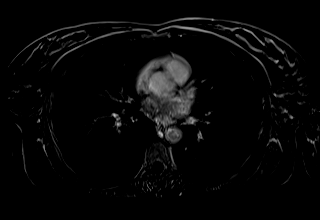

[43 of 48 positions shown; findings below may reference images not displayed]

FINDINGS: Mild motion degradation, primarily involving the pre and
postcontrast dynamic series and the dedicated MRCP sequences.

Lower chest: Normal heart size without pericardial or pleural
effusion.

Hepatobiliary: 2 left hepatic lobe cysts, including a dominant
lateral segment left liver lobe minimally complex cyst of 3.2 cm on
[DATE]. No suspicious liver lesion.

Normal gallbladder. No intrahepatic biliary duct dilatation. The
common duct is upper normal for age, including at 6 mm on [DATE] and
45/13. No choledocholithiasis.

Pancreas:  Normal, without mass or ductal dilatation.

Spleen:  Normal in size, without focal abnormality.

Adrenals/Urinary Tract: Normal adrenal glands. Normal kidneys,
without hydronephrosis.

Stomach/Bowel: Normal stomach and small bowel loops. Colonic stool
burden suggests constipation.

Vascular/Lymphatic: Normal caliber of the aorta and branch vessels.
No abdominal adenopathy.

Other:  No ascites.

Musculoskeletal: No acute osseous abnormality.
IMPRESSION: 1. Mild motion degradation.
2. Upper normal common duct size. No choledocholithiasis or
intrahepatic duct dilatation. Most likely within normal variation.
3. Possible constipation.

## 2020-03-25 IMAGING — MR MR 3D RECON AT SCANNER
15 of 19 series · 37 of 48 positions shown · IV contrast (gadavist)
Comparison: None.

CLINICAL DATA: Right upper quadrant pain. Outside CT demonstrating
biliary duct dilatation.

EXAM:
MRI ABDOMEN WITHOUT AND WITH CONTRAST (INCLUDING MRCP)
TECHNIQUE: Multiplanar multisequence MR imaging of the abdomen was performed
both before and after the administration of intravenous contrast.
Heavily T2-weighted images of the biliary and pancreatic ducts were
obtained, and three-dimensional MRCP images were rendered by post
processing.
CONTRAST:  6mL GADAVIST GADOBUTROL 1 MMOL/ML IV SOLN

[Series 3: T2 fat-sat · axial · 6.0mm · 1.25mm/px · z∈[-145,+93]mm · 2 of 34 slices shown]
[im 1/34]
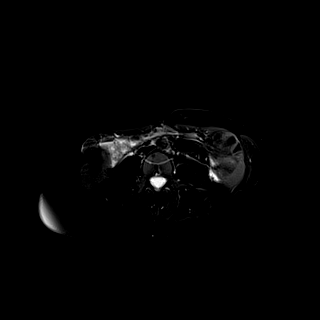
[im 34/34]
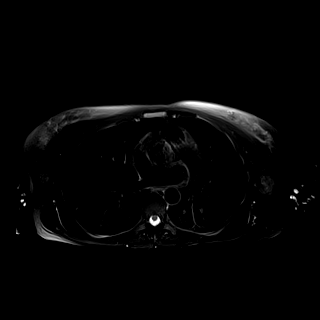

[Series 6: T2 · coronal · 6.0mm · 1.48mm/px · 1 of 25 slices shown (1 of 2)]
[im 1/25]
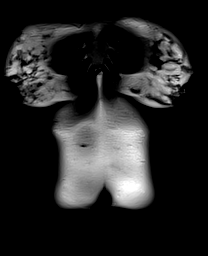

[Series 7: DWI · axial · 6.0mm · 1.49mm/px · z∈[-169,+83]mm · 3 of 72 slices shown (1 of 2)]
[im 1/72]
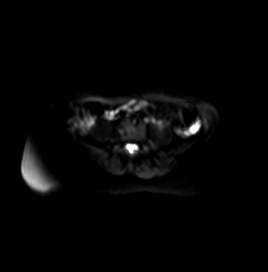
[im 36/72]
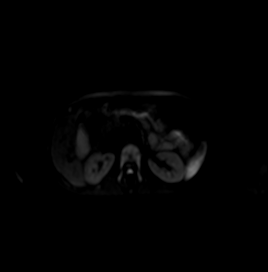
[im 72/72]
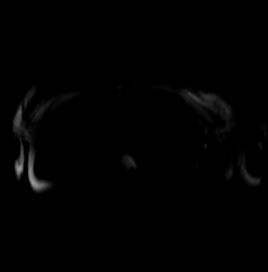

[Series 8: DWI · axial · 6.0mm · 1.49mm/px · z∈[-169,+83]mm · 2 of 36 slices shown (2 of 2)]
[im 1/36]
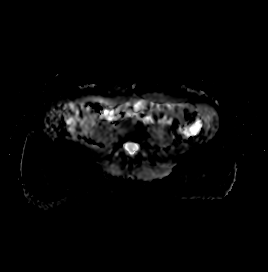
[im 36/36]
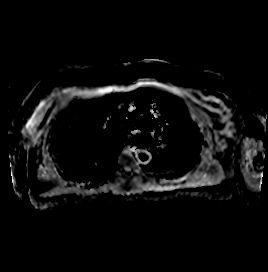

[Series 9: T1 · axial · 3.0mm · 1.12mm/px · z∈[-142,+71]mm · 3 of 72 slices shown (1 of 2)]
[im 1/72]
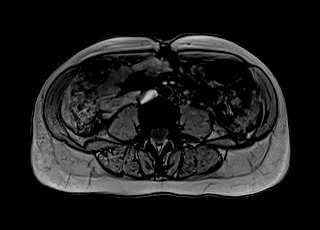
[im 36/72]
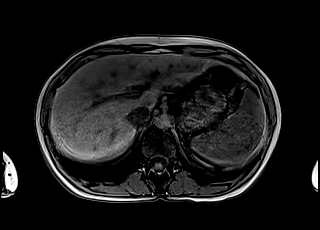
[im 72/72]
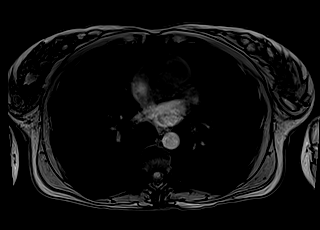

[Series 10: T1 · axial · 3.0mm · 1.12mm/px · z∈[-142,+71]mm · 3 of 72 slices shown (2 of 2)]
[im 1/72]
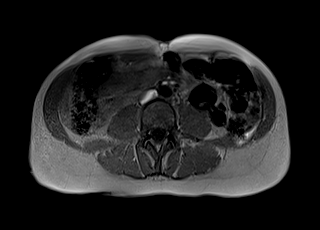
[im 36/72]
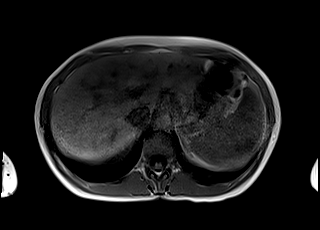
[im 72/72]
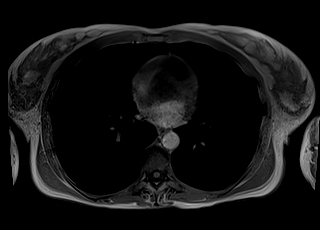

[Series 11: cor obl thk · sagittal · 50.0mm · 0.78mm/px · 1 of 9 slices shown]
[im 1/9]
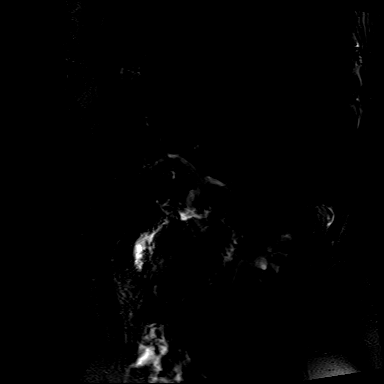

[Series 13: cor_3d_spc_trig · coronal · 1.0mm · 0.49mm/px · 4 of 88 slices shown]
[im 1/88]
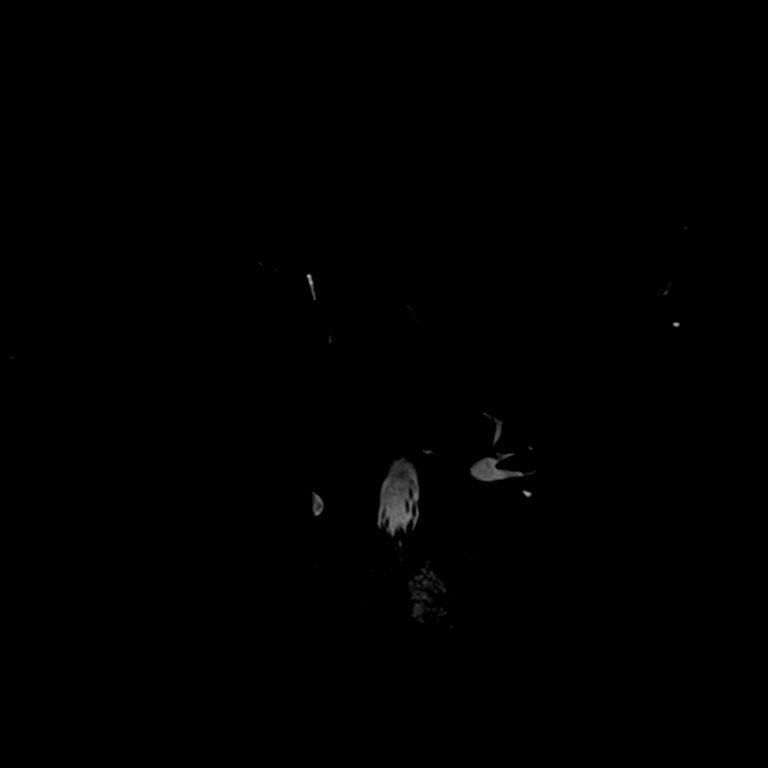
[im 30/88]
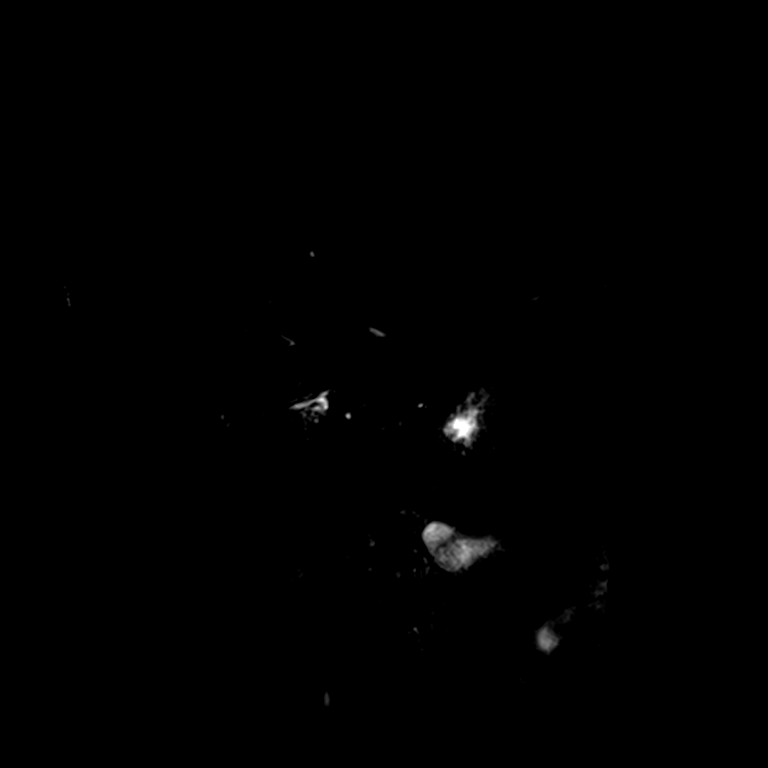
[im 59/88]
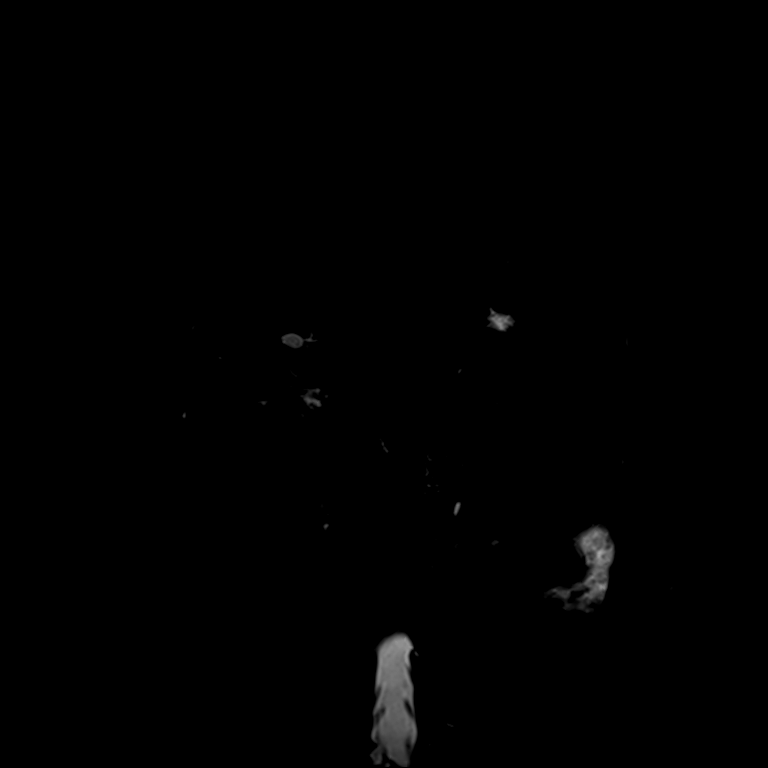
[im 88/88]
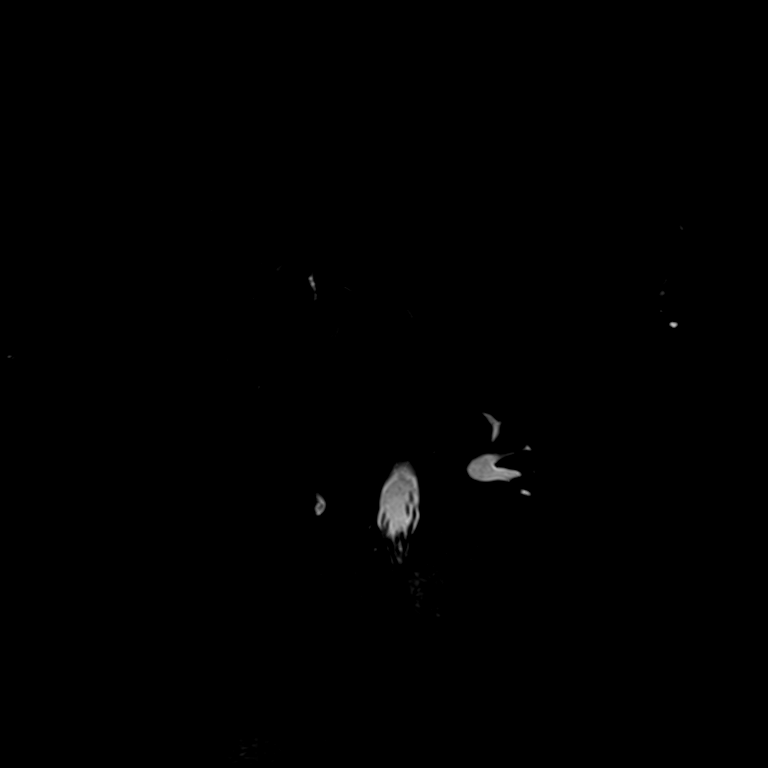

[Series 15: T2 · axial · 6.0mm · 1.41mm/px · 1 of 35 slices shown (2 of 2)]
[im 1/35]
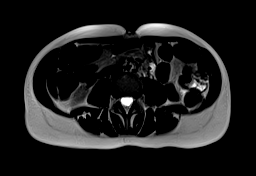

[Series 17: T1 dynamic · axial · 3.0mm · 1.12mm/px · z∈[-151,+86]mm · 3 of 80 slices shown (1 of 5)]
[im 1/80]
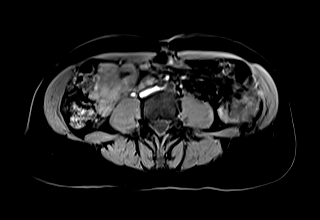
[im 40/80]
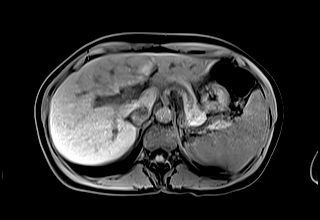
[im 80/80]
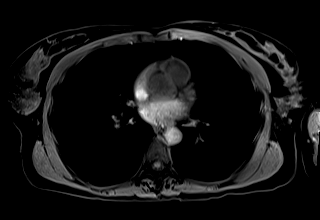

[Series 20: T1 dynamic · axial · 3.0mm · 1.12mm/px · z∈[-151,+86]mm · 3 of 80 slices shown (2 of 5)]
[im 1/80]
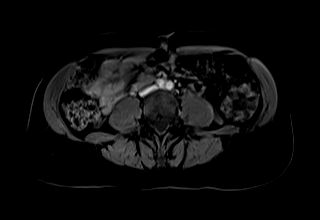
[im 40/80]
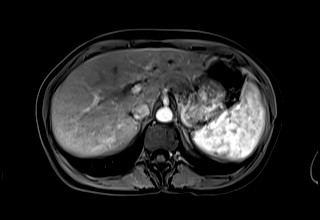
[im 80/80]
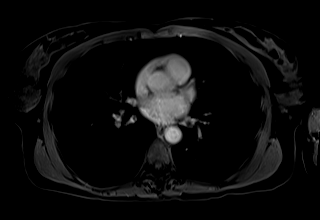

[Series 22: T1 dynamic · axial · 3.0mm · 1.12mm/px · z∈[-151,+86]mm · 3 of 80 slices shown (3 of 5)]
[im 1/80]
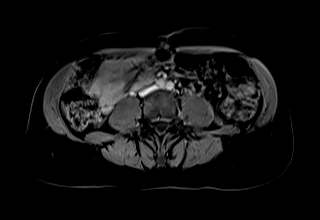
[im 40/80]
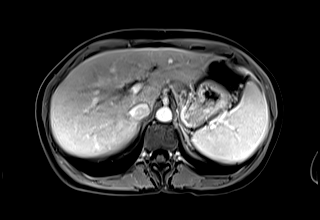
[im 80/80]
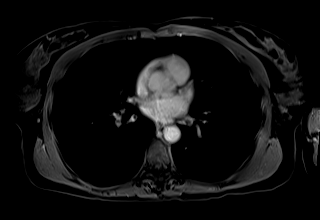

[Series 24: T1 dynamic · axial · 3.0mm · 1.12mm/px · z∈[-151,+86]mm · 3 of 80 slices shown (4 of 5)]
[im 1/80]
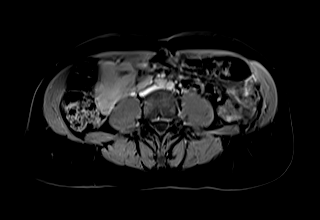
[im 40/80]
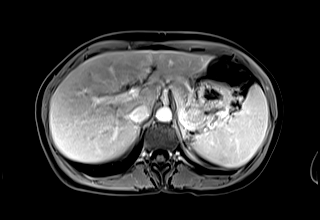
[im 80/80]
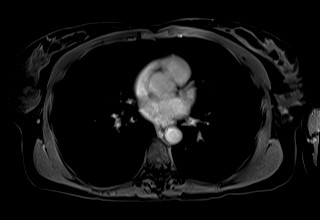

[Series 26: T1 dynamic · coronal · 3.0mm · 1.41mm/px · 3 of 60 slices shown (5 of 5)]
[im 1/60]
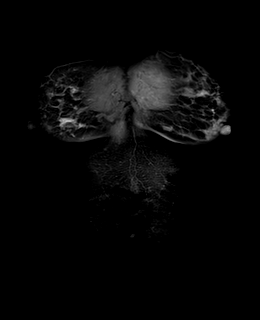
[im 30/60]
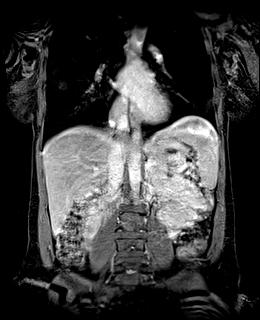
[im 60/60]
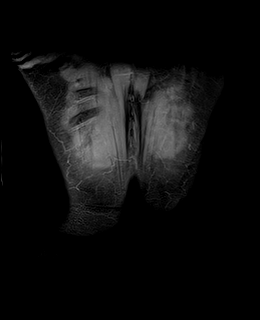

[Series 100: subtraction 20 sec · axial · 3.0mm · 1.12mm/px · z∈[-151,-34]mm · 2 of 80 slices shown]
[im 1/80]
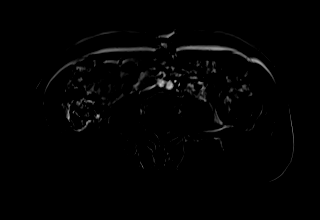
[im 40/80]
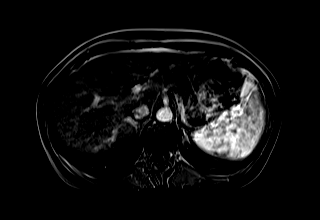

[37 of 48 positions shown; findings below may reference images not displayed]

FINDINGS: Mild motion degradation, primarily involving the pre and
postcontrast dynamic series and the dedicated MRCP sequences.

Lower chest: Normal heart size without pericardial or pleural
effusion.

Hepatobiliary: 2 left hepatic lobe cysts, including a dominant
lateral segment left liver lobe minimally complex cyst of 3.2 cm on
[DATE]. No suspicious liver lesion.

Normal gallbladder. No intrahepatic biliary duct dilatation. The
common duct is upper normal for age, including at 6 mm on [DATE] and
45/13. No choledocholithiasis.

Pancreas:  Normal, without mass or ductal dilatation.

Spleen:  Normal in size, without focal abnormality.

Adrenals/Urinary Tract: Normal adrenal glands. Normal kidneys,
without hydronephrosis.

Stomach/Bowel: Normal stomach and small bowel loops. Colonic stool
burden suggests constipation.

Vascular/Lymphatic: Normal caliber of the aorta and branch vessels.
No abdominal adenopathy.

Other:  No ascites.

Musculoskeletal: No acute osseous abnormality.
IMPRESSION: 1. Mild motion degradation.
2. Upper normal common duct size. No choledocholithiasis or
intrahepatic duct dilatation. Most likely within normal variation.
3. Possible constipation.

## 2020-03-25 MED ORDER — GADOBUTROL 1 MMOL/ML IV SOLN
6.0000 mL | Freq: Once | INTRAVENOUS | Status: AC | PRN
  Administered 2020-03-25: 6 mL via INTRAVENOUS

## 2020-03-25 NOTE — Telephone Encounter (Signed)
See result note.  

## 2020-03-25 NOTE — Telephone Encounter (Signed)
-----   Message from Yetta Flock, MD sent at 03/25/2020 12:47 PM EDT ----- [facility] Set[employer] can you help relay the following: - MRCP shows biliary tree within normal limits. Nothing concerning in regards to prior CT changes. Biliary tree does not show any abnormal dilation or evidence of obstruction. Pancreas is normal as is her gallbladder. LFTs were normal - I don't think she needs any further imaging regarding this issue - I had recommended a trial of PPI before for her symptoms and she declined, if symptoms persist she may want to reconsider this - she is due for screening colonoscopy, can you help book for her if she is willing? Thanks

## 2020-03-25 NOTE — Telephone Encounter (Signed)
Called and left message to please call back, via interpreter Roxana # [phone]

## 2021-08-25 ENCOUNTER — Ambulatory Visit (INDEPENDENT_AMBULATORY_CARE_PROVIDER_SITE_OTHER): Payer: 59 | Admitting: Emergency Medicine

## 2021-08-25 ENCOUNTER — Other Ambulatory Visit: Payer: Self-pay

## 2021-08-25 ENCOUNTER — Encounter: Payer: Self-pay | Admitting: Emergency Medicine

## 2021-08-25 VITALS — BP 122/78 | HR 83 | Ht 65.0 in | Wt 142.0 lb

## 2021-08-25 DIAGNOSIS — Z13228 Encounter for screening for other metabolic disorders: Secondary | ICD-10-CM | POA: Diagnosis not present

## 2021-08-25 DIAGNOSIS — J069 Acute upper respiratory infection, unspecified: Secondary | ICD-10-CM

## 2021-08-25 DIAGNOSIS — Z13220 Encounter for screening for lipoid disorders: Secondary | ICD-10-CM

## 2021-08-25 DIAGNOSIS — Z0000 Encounter for general adult medical examination without abnormal findings: Secondary | ICD-10-CM | POA: Diagnosis not present

## 2021-08-25 DIAGNOSIS — Z130 Encounter for screening for diseases of the blood and blood-forming organs and certain disorders involving the immune mechanism: Secondary | ICD-10-CM

## 2021-08-25 DIAGNOSIS — Z1211 Encounter for screening for malignant neoplasm of colon: Secondary | ICD-10-CM

## 2021-08-25 DIAGNOSIS — Z1159 Encounter for screening for other viral diseases: Secondary | ICD-10-CM

## 2021-08-25 DIAGNOSIS — Z1329 Encounter for screening for other suspected endocrine disorder: Secondary | ICD-10-CM

## 2021-08-25 DIAGNOSIS — Z1231 Encounter for screening mammogram for malignant neoplasm of breast: Secondary | ICD-10-CM | POA: Diagnosis not present

## 2021-08-25 DIAGNOSIS — Z114 Encounter for screening for human immunodeficiency virus [HIV]: Secondary | ICD-10-CM

## 2021-08-25 LAB — CBC WITH DIFFERENTIAL/PLATELET
Basophils Absolute: 0.1 10*3/uL (ref 0.0–0.1)
Basophils Relative: 1.1 % (ref 0.0–3.0)
Eosinophils Absolute: 0.3 10*3/uL (ref 0.0–0.7)
Eosinophils Relative: 6.6 % — ABNORMAL HIGH (ref 0.0–5.0)
HCT: 43.6 % (ref 36.0–46.0)
Hemoglobin: 14.3 g/dL (ref 12.0–15.0)
Lymphocytes Relative: 21.6 % (ref 12.0–46.0)
Lymphs Abs: 1.1 10*3/uL (ref 0.7–4.0)
MCHC: 32.7 g/dL (ref 30.0–36.0)
MCV: 84.6 fl (ref 78.0–100.0)
Monocytes Absolute: 0.4 10*3/uL (ref 0.1–1.0)
Monocytes Relative: 8.9 % (ref 3.0–12.0)
Neutro Abs: 3.1 10*3/uL (ref 1.4–7.7)
Neutrophils Relative %: 61.8 % (ref 43.0–77.0)
Platelets: 139 10*3/uL — ABNORMAL LOW (ref 150.0–400.0)
RBC: 5.15 Mil/uL — ABNORMAL HIGH (ref 3.87–5.11)
RDW: 13.4 % (ref 11.5–15.5)
WBC: 5 10*3/uL (ref 4.0–10.5)

## 2021-08-25 LAB — COMPREHENSIVE METABOLIC PANEL
ALT: 13 U/L (ref 0–35)
AST: 24 U/L (ref 0–37)
Albumin: 4.4 g/dL (ref 3.5–5.2)
Alkaline Phosphatase: 75 U/L (ref 39–117)
BUN: 11 mg/dL (ref 6–23)
CO2: 31 mEq/L (ref 19–32)
Calcium: 10.2 mg/dL (ref 8.4–10.5)
Chloride: 101 mEq/L (ref 96–112)
Creatinine, Ser: 0.86 mg/dL (ref 0.40–1.20)
GFR: 78.81 mL/min (ref >60.00)
Glucose, Bld: 74 mg/dL (ref 70–99)
Potassium: 4.3 mEq/L (ref 3.5–5.1)
Sodium: 138 mEq/L (ref 135–145)
Total Bilirubin: 1 mg/dL (ref 0.2–1.2)
Total Protein: 8 g/dL (ref 6.0–8.3)

## 2021-08-25 LAB — LIPID PANEL
Cholesterol: 186 mg/dL (ref 0–200)
HDL: 90.8 mg/dL (ref >39.00)
LDL Cholesterol: 79 mg/dL (ref 0–99)
NonHDL: 95.01
Total CHOL/HDL Ratio: 2
Triglycerides: 82 mg/dL (ref 0.0–149.0)
VLDL: 16.4 mg/dL (ref 0.0–40.0)

## 2021-08-25 LAB — HEMOGLOBIN A1C: Hgb A1c MFr Bld: 5.2 % (ref 4.6–6.5)

## 2021-08-25 LAB — TSH: TSH: 3.56 u[IU]/mL (ref 0.35–5.50)

## 2021-08-25 NOTE — Patient Instructions (Signed)

## 2021-08-25 NOTE — Progress Notes (Signed)
Mackenzie Floyd 50 y.o.   Chief Complaint  Patient presents with   Annual Exam    HISTORY OF PRESENT ILLNESS: This is a 50 y.o. female here for annual exam. Recently saw gynecologist with no abnormal findings. Last visit with me in June 2021.  She was then referred to GI doctor who saw her.  Assessment and plan as follows: ASSESSMENT AND PLAN: 50 year old female here for new patient assessment the following:   Abnormal imaging of the biliary tree / right upper quadrant discomfort - sounds like intermittent symptoms for a long time, CT scan in January showed some nonspecific mild biliary ductal dilation.  I do not have any baseline labs on file for her.  I discussed differential for her CT imaging abnormalities and her symptoms.  Recommending LFTs today and will also check CBC.  Based on her CT scan findings I am recommending an MRCP to further evaluate her biliary tree.  That being said unclear if her symptoms are related to the biliary ductal dilation or not.  I will screen her for H pylori IgG and offer her a trial of PPI to see if this will help some of her postprandial symptoms.  Apparently prior ultrasound showed no evidence of gallstones.  She wishes to pursue MRCP and labs today, but symptoms are mild and she declines trial of PPI when offered.  She will consider this pending her course.  All questions answered she agreed with the plan.   Colon cancer screening - she is due for routine colon cancer screening.  Discussed colonoscopy and risk benefits of it.  She is willing to proceed with this at some point time but wishes to get her MRI done first and will call to schedule when she wants to proceed with colonoscopy.   Wardner Cellar, MD [facility]  Subsequently she had normal blood work and normal MRCP. Has had mild sore throat for 1 week.  Think she has a cold.  Making progress. No other complaints or medical concerns today.  HPI   Prior to Admission medications    Medication Sig Start Date End Date Taking? Authorizing Provider  Magnesium Hydroxide (MAGNESIA PO) Take by mouth daily.   Yes [provider]  VITAMIN D PO Take 2,000 mg by mouth daily.   Yes [provider]    No Known Allergies  There are no problems to display for this patient.   No past medical history on file.  Past Surgical History:  Procedure Laterality Date   CESAREAN SECTION N/A 1997 and 2008   Phreesia 03/04/2020    Social History   Socioeconomic History   Marital status: Married    Spouse name: Not on file   Number of children: 2   Years of education: Not on file   Highest education level: Not on file  Occupational History   Not on file  Tobacco Use   Smoking status: Never   Smokeless tobacco: Never  Substance and Sexual Activity   Alcohol use: Yes    Comment: socially beer and wine   Drug use: Never   Sexual activity: Not on file  Other Topics Concern   Not on file  Social History Narrative   Not on file   Social Determinants of Health   Financial Resource Strain: Not on file  Food Insecurity: Not on file  Transportation Needs: Not on file  Physical Activity: Not on file  Stress: Not on file  Social Connections: Not on file  Intimate Partner  Violence: Not on file    Family History  Problem Relation Age of Onset   Stroke Father    Hypertension Father      Review of Systems  Constitutional: Negative.  Negative for chills, fever and malaise/fatigue.  HENT:  Positive for sore throat.   Eyes: Negative.   Respiratory: Negative.  Negative for cough and shortness of breath.   Cardiovascular: Negative.  Negative for chest pain and palpitations.  Genitourinary: Negative.  Negative for dysuria and hematuria.  Musculoskeletal: Negative.   Skin: Negative.  Negative for rash.  Neurological: Negative.  Negative for dizziness and headaches.  All other systems reviewed and are negative.  Today's Vitals   08/25/21 0954  BP: 122/78   Pulse: 83  SpO2: 96%  Weight: 142 lb (64.4 kg)  Height: 5\' 5"  (1.651 m)   Body mass index is 23.63 kg/m.  Physical Exam Vitals reviewed.  Constitutional:      Appearance: Normal appearance.  HENT:     Head: Normocephalic.     Right Ear: Tympanic membrane, ear canal and external ear normal.     Left Ear: Tympanic membrane, ear canal and external ear normal.     Mouth/Throat:     Mouth: Mucous membranes are moist.     Pharynx: Oropharynx is clear.  Eyes:     Extraocular Movements: Extraocular movements intact.     Conjunctiva/sclera: Conjunctivae normal.     Pupils: Pupils are equal, round, and reactive to light.  Cardiovascular:     Rate and Rhythm: Normal rate and regular rhythm.     Pulses: Normal pulses.     Heart sounds: Normal heart sounds.  Pulmonary:     Effort: Pulmonary effort is normal.     Breath sounds: Normal breath sounds.  Abdominal:     General: Bowel sounds are normal. There is no distension.     Palpations: Abdomen is soft.     Tenderness: There is no abdominal tenderness.  Musculoskeletal:        General: Normal range of motion.     Cervical back: Normal range of motion and neck supple. No tenderness.  Lymphadenopathy:     Cervical: No cervical adenopathy.  Skin:    General: Skin is warm and dry.     Capillary Refill: Capillary refill takes less than 2 seconds.  Neurological:     General: No focal deficit present.     Mental Status: She is alert and oriented to person, place, and time.  Psychiatric:        Mood and Affect: Mood normal.        Behavior: Behavior normal.     ASSESSMENT & PLAN: Problem List Items Addressed This Visit   None Visit Diagnoses     Routine general medical examination at a health care facility    -  Primary   Need for hepatitis C screening test       Relevant Orders   Hepatitis C antibody screen   Screening for HIV (human immunodeficiency virus)       Relevant Orders   HIV antibody   Visit for screening  mammogram       Relevant Orders   Mammogram Digital Screening   Colon cancer screening       Relevant Orders   Ambulatory referral to Gastroenterology   Screening for deficiency anemia       Relevant Orders   CBC with Differential   Screening for lipoid disorders  Relevant Orders   Lipid panel   Screening for endocrine, metabolic and immunity disorder       Relevant Orders   Comprehensive metabolic panel   Hemoglobin A1c   TSH   Upper respiratory tract infection, unspecified type            Modifiable risk factors discussed with patient. Anticipatory guidance according to age provided. The following topics were also discussed: Social Determinants of Health Smoking.  Non-smoker Diet and nutrition Benefits of exercise Cancer screening and need to screen for colon cancer and breast cancer. Vaccinations recommendations.  Presently having URI.  Will wait 1 to 2 weeks before getting flu vaccine. Cardiovascular risk assessment Mental health including depression and anxiety Fall and accident prevention  Patient Instructions  Mantenimiento de Technical sales engineer en [place] [employer], Female Adoptar un estilo de vida saludable y recibir atencin preventiva son importantes para promover la salud y Musician. Consulte al mdico sobre: El esquema adecuado para hacerse pruebas y exmenes peridicos. Cosas que puede hacer por su cuenta para prevenir enfermedades y [place] sano. Qu debo saber sobre la dieta, el peso y el ejercicio? Consuma una dieta saludable  Consuma una dieta que incluya muchas verduras, frutas, productos lcteos con bajo contenido de [place] y Advertising account planner. No consuma muchos alimentos ricos en grasas slidas, azcares agregados o sodio. Mantenga un peso saludable El ndice de masa muscular [facility]) se [place] para identificar problemas de [place]. Proporciona una estimacin de la grasa corporal basndose en el peso y la altura. Su mdico puede ayudarle a  Radiation protection practitioner [place] y a Scientist, forensic o Theatre manager un peso saludable. Haga ejercicio con regularidad Haga ejercicio con regularidad. Esta es una de las prcticas ms importantes que puede hacer por su salud. La [employer] de los adultos deben seguir estas pautas: Optometrist, al menos, 150 minutos de actividad fsica por semana. El ejercicio debe aumentar la frecuencia cardaca y Nature conservation officer transpirar (ejercicio de intensidad moderada). Hacer ejercicios de fortalecimiento por lo [employer] por semana. Agregue esto a su plan de ejercicio de intensidad moderada. Pase menos tiempo sentada. Incluso la actividad fsica ligera puede ser beneficiosa. Controle sus niveles de colesterol y lpidos en la sangre Comience a realizarse anlisis de lpidos y Research officer, trade union en la sangre a los 50 aos y luego reptalos cada 5 aos. Hgase controlar los niveles de colesterol con mayor frecuencia si: Sus niveles de lpidos y colesterol son altos. Es mayor de 41 aos. Presenta un alto riesgo de padecer enfermedades cardacas. Qu debo saber sobre las pruebas de deteccin del cncer? Segn su historia clnica y sus antecedentes familiares, es posible que deba realizarse pruebas de deteccin del cncer en diferentes edades. Esto puede incluir pruebas de deteccin de lo siguiente: Cncer de mama. Cncer de cuello uterino. Cncer colorrectal. Cncer de piel. Cncer de pulmn. Qu debo saber sobre la enfermedad cardaca, la diabetes y la hipertensin arterial? Presin arterial y enfermedad cardaca La hipertensin arterial causa enfermedades cardacas y [place] el riesgo de accidente cerebrovascular. Es ms probable que esto se manifieste en las personas que tienen lecturas de presin arterial alta o tienen sobrepeso. Hgase controlar la presin arterial: Cada 3 a 5 aos si tiene entre 18 y 74 aos. Todos los aos si es mayor de 40 aos. Diabetes Realcese exmenes de deteccin de la diabetes con regularidad. Este anlisis revisa  el nivel de azcar en la sangre en [place]. Hgase las pruebas de deteccin: Cada tres aos despus de los 52 aos de edad  si tiene un peso normal y un bajo riesgo de padecer diabetes. Con ms frecuencia y a partir de [place] edad inferior si tiene sobrepeso o un alto riesgo de padecer diabetes. Qu debo saber sobre la prevencin de infecciones? Hepatitis B Si tiene un riesgo ms alto de contraer hepatitis B, debe someterse a un examen de deteccin de este virus. Hable con el mdico para averiguar si tiene riesgo de contraer la infeccin por hepatitis B. Hepatitis C Se recomienda el anlisis a: [employer] 1945 y 1965. Todas las personas que tengan un riesgo de haber contrado hepatitis C. Enfermedades de transmisin sexual (ETS) Hgase las pruebas de Programme researcher, broadcasting/film/video de ITS, incluidas la gonorrea y la clamidia, si: Es sexualmente activa y es menor de 3 aos. Es mayor de 33 aos, y Investment banker, operational informa que corre riesgo de tener este tipo de infecciones. La actividad sexual ha cambiado desde que le hicieron la ltima prueba de deteccin y tiene un riesgo mayor de Best boy clamidia o Radio broadcast assistant. Pregntele al mdico si usted tiene riesgo. Pregntele al mdico si usted tiene un alto riesgo de Museum/gallery curator VIH. El mdico tambin puede recomendarle un medicamento recetado para ayudar a evitar la infeccin por el VIH. Si elige tomar medicamentos para prevenir el VIH, primero debe [employer] de deteccin del VIH. Luego debe hacerse anlisis cada 3 meses mientras est tomando los medicamentos. Embarazo Si est por dejar de Librarian, academic (fase premenopusica) y usted puede quedar [place], busque asesoramiento antes de [place]. Tome de 400 a 800 microgramos (mcg) de cido [employer] si [place]. Pida mtodos de control de la natalidad (anticonceptivos) si desea evitar un embarazo no deseado. Osteoporosis y [place] La osteoporosis es una enfermedad en la que los  huesos pierden los minerales y la fuerza por el avance de la edad. El resultado pueden ser fracturas en los [place]. Si tiene 66 aos o ms, o si est en riesgo de sufrir osteoporosis y fracturas, pregunte a su mdico si debe: Hacerse pruebas de deteccin de prdida sea. Tomar un suplemento de calcio o de vitamina D para reducir el riesgo de fracturas. Recibir terapia de reemplazo hormonal (TRH) para tratar los sntomas de la menopausia. Siga estas indicaciones en su casa: Consumo de alcohol No beba alcohol si: Su mdico le indica no hacerlo. Est embarazada, puede estar embarazada o est tratando de [place]. Si bebe alcohol: Limite la cantidad que bebe a lo siguiente: De 0 a 1 bebida por da. Sepa cunta cantidad de alcohol hay en las bebidas que toma. En los [place], una medida equivale a una botella de cerveza de 12 oz (355 ml), un vaso de vino de 5 oz (148 ml) o un vaso de una bebida alcohlica de alta graduacin de 1 oz (44 ml). Estilo de vida No consuma ningn producto que contenga nicotina o tabaco. Estos productos incluyen cigarrillos, tabaco para Higher education careers adviser y aparatos de vapeo, como los Psychologist, sport and exercise. Si necesita ayuda para dejar de consumir estos productos, consulte al mdico. No consuma drogas. No comparta agujas. Solicite ayuda a su mdico si necesita apoyo o informacin para abandonar las drogas. Indicaciones generales Realcese los estudios de rutina de la salud, dentales y de Public librarian. [place]. Infrmele a su mdico si: Se siente deprimida con frecuencia. Alguna vez ha sido vctima de [place] o no se siente seguro en su casa. Resumen Adoptar un estilo de vida saludable y recibir atencin preventiva son  importantes para promover la salud y Musician. Siga las instrucciones del mdico acerca de una dieta saludable, el ejercicio y la realizacin de pruebas o exmenes para Engineer, building services. Siga las instrucciones del  mdico con respecto al control del colesterol y la presin arterial. Esta informacin no tiene Marine scientist el consejo del mdico. Asegrese de hacerle al mdico cualquier pregunta que tenga. Document Revised: 02/05/2021 Document Reviewed: 02/05/2021 Elsevier Patient Education  2022 [place], MD [facility]

## 2021-08-26 LAB — HIV ANTIBODY (ROUTINE TESTING W REFLEX): HIV 1&2 Ab, 4th Generation: NONREACTIVE

## 2021-08-26 LAB — HEPATITIS C ANTIBODY
Hepatitis C Ab: NONREACTIVE
SIGNAL TO CUT-OFF: 0.02 (ref <1.00)

## 2021-09-08 ENCOUNTER — Telehealth: Payer: Self-pay | Admitting: Emergency Medicine

## 2021-09-08 NOTE — Telephone Encounter (Signed)
Connected to [facility] 12.26.2022.   Caller states, pt has sore throat x 2 weeks. Wanting medicine called in. CBWN Caller states she missed the nurse's call because she didn't hear her phone.   Advised to see PCP within 24 hours.  No appointments available, with PCP, or another office. Please advise as to where to schedule pt.

## 2021-09-09 NOTE — Telephone Encounter (Signed)
Best medication is time.  It has to run its course.

## 2021-09-10 NOTE — Telephone Encounter (Signed)
Called and got pt schedule for e visit next week with Dr. Maudie Mercury.

## 2021-09-15 ENCOUNTER — Telehealth (INDEPENDENT_AMBULATORY_CARE_PROVIDER_SITE_OTHER): Payer: 59 | Admitting: Family Medicine

## 2021-09-15 ENCOUNTER — Encounter: Payer: Self-pay | Admitting: Family Medicine

## 2021-09-15 DIAGNOSIS — J029 Acute pharyngitis, unspecified: Secondary | ICD-10-CM

## 2021-09-15 DIAGNOSIS — R059 Cough, unspecified: Secondary | ICD-10-CM | POA: Diagnosis not present

## 2021-09-15 MED ORDER — DOXYCYCLINE HYCLATE 100 MG PO TABS: 100.0000 mg | ORAL_TABLET | Freq: Two times a day (BID) | ORAL | 0 refills | Status: DC

## 2021-09-15 MED ORDER — BENZONATATE 100 MG PO CAPS: ORAL_CAPSULE | ORAL | 0 refills | Status: DC

## 2021-09-15 NOTE — Progress Notes (Signed)
Virtual Visit via Telephone Note  I connected with Mackenzie Floyd on 09/15/21 at 10:20 AM EST by telephone and verified that I am speaking with the correct person using two identifiers.   Pt gave permission for a phone visit.   Location patient:  [place] Location provider: work or home office Participants present for the call: patient, provider Patient did not have a visit with me in the prior 7 days to address this/these issue(s).   History of Present Illness:  Acute telemedicine visit for cough and congestion: -Onset: about 1 month ago -Symptoms include: started with a cold, but now has developed sinus issues, sore throat, drainage in throat, worsening cough -she went to [facility] and had negative strep, flu, covid testing and was given prednisone but did not help and she wants to start an abx -Denies:fever, CP, SOB, NVD -Pertinent past medical history: see below -Pertinent medication allergies: No Known Allergies -COVID-19 vaccine status: There is no immunization history for the selected administration types on file for this patient. -no chance of pregnancy or breastfeedin   History reviewed. No pertinent past medical history.  Current Outpatient Medications on File Prior to Visit  Medication Sig Dispense Refill   Magnesium Hydroxide (MAGNESIA PO) Take by mouth daily.     predniSONE (DELTASONE) 20 MG tablet Take 20 mg by mouth daily.     VITAMIN D PO Take 2,000 mg by mouth daily.     No current facility-administered medications on file prior to visit.    Observations/Objective: Patient sounds cheerful and well on the phone. I do not appreciate any SOB. Speech and thought processing are grossly intact. Patient reported vitals:  Assessment and Plan:  Cough, unspecified type  Sore throat  -we discussed possible serious and likely etiologies, options for evaluation and workup, limitations of telemedicine visit vs in person visit, treatment, treatment risks and precautions. Pt prefers  to treat via telemedicine empirically rather than in person at this moment. Query new viral illness vs 2ndary bacterial illness vs other. She has agreed to try nasal saline, salt water gargles, tessalon rx for cough and wishes to try empiric doxy 100mg  bid x 7 days if worsening or not improving with the other measures.  Advised to seek prompt virtual visit or in person care if worsening, new symptoms arise, or if is not improving with treatment as expected per our conversation of expected course. Discussed options for follow up care. Did let this patient know that I do telemedicine on Tuesdays and Thursdays for [place] and those are the days I am logged into the system. Advised to schedule follow up visit with PCP, [place] virtual visits or [facility] if any further questions or concerns to avoid delays in care.   I discussed the assessment and treatment plan with the patient. The patient was provided an opportunity to ask questions and all were answered. The patient agreed with the plan and demonstrated an understanding of the instructions.    Follow Up Instructions:  I did not refer this patient for an OV with me in the next 24 hours for this/these issue(s).  I discussed the assessment and treatment plan with the patient. The patient was provided an opportunity to ask questions and all were answered. The patient agreed with the plan and demonstrated an understanding of the instructions.   I spent 15 minutes on the date of this visit in the care of this patient. See summary of tasks completed to properly care for this patient in the detailed notes  above which also included counseling of above, review of PMH, medications, allergies, evaluation of the patient and ordering and/or  instructing patient on testing and care options.     [name], DO

## 2021-09-15 NOTE — Patient Instructions (Addendum)
-  I sent the medication(s) we discussed to your pharmacy: Meds ordered this encounter  Medications   doxycycline (VIBRA-TABS) 100 MG tablet    Sig: Take 1 tablet (100 mg total) by mouth 2 (two) times daily.    Dispense:  14 tablet    Refill:  0   benzonatate (TESSALON PERLES) 100 MG capsule    Sig: 1-2 capsules up to twice daily as needed for cough    Dispense:  30 capsule    Refill:  0     I hope you are feeling better soon!  Seek in person care promptly if your symptoms worsen, new concerns arise or you are not improving with treatment.  It was nice to meet you today. I help [place] out with telemedicine visits on Tuesdays and Thursdays and am happy to help if you need a virtual follow up visit on those days. Otherwise, if you have any concerns or questions following this visit please schedule a follow up visit with your Primary Care office or seek care at a local urgent care clinic to avoid delays in care

## 2021-12-14 ENCOUNTER — Other Ambulatory Visit: Payer: Self-pay

## 2021-12-14 ENCOUNTER — Ambulatory Visit (AMBULATORY_SURGERY_CENTER): Payer: 59

## 2021-12-14 VITALS — Ht 65.0 in | Wt 138.0 lb

## 2021-12-14 DIAGNOSIS — Z1211 Encounter for screening for malignant neoplasm of colon: Secondary | ICD-10-CM

## 2021-12-14 MED ORDER — NA SULFATE-K SULFATE-MG SULF 17.5-3.13-1.6 GM/177ML PO SOLN: 1.0000 | Freq: Once | ORAL | 0 refills | Status: AC

## 2021-12-14 NOTE — Progress Notes (Signed)
No egg or soy allergy known to patient  ?No issues known to pt with past sedation with any surgeries or procedures ?Patient denies ever being told they had issues or difficulty with intubation  ?No FH of Malignant Hyperthermia ?Pt is not on diet pills ?Pt is not on home 02  ?Pt is not on blood thinners  ?Pt reports issues with constipation - patient reports she consumes a protein shake that has prunes in it- does not take any medications to assist with constipation-patient advised to increase water/po fluid intake/activity- advised to take OTC meds if needed;  ?No A fib or A flutter ?Patient advised to use SingleCare to decrease the cost of prep; ?NO PA's for preps discussed with pt In PV today  ?Discussed with pt there will be an out-of-pocket cost for prep and that varies from $0 to 70 +  dollars - pt verbalized understanding  ?Due to the COVID-19 pandemic we are asking patients to follow certain guidelines in PV and the [place]   ?Pt aware of COVID protocols and [facility] guidelines  ?PV completed over the phone. Pt verified name, DOB, address and insurance during PV today.  ?Pt mailed instruction packet with copy of consent form to read and not return, and instructions.  ?Pt encouraged to call with questions or issues.  ?If pt has My chart, procedure instructions sent via My Chart  ? ?

## 2022-01-08 ENCOUNTER — Encounter: Payer: Self-pay | Admitting: Gastroenterology

## 2022-01-20 ENCOUNTER — Encounter: Payer: Self-pay | Admitting: Gastroenterology

## 2022-01-20 ENCOUNTER — Ambulatory Visit (AMBULATORY_SURGERY_CENTER): Payer: 59 | Admitting: Gastroenterology

## 2022-01-20 VITALS — BP 108/76 | HR 74 | Temp 97.3°F | Resp 10 | Ht 65.0 in | Wt 138.0 lb

## 2022-01-20 DIAGNOSIS — Z1211 Encounter for screening for malignant neoplasm of colon: Secondary | ICD-10-CM | POA: Diagnosis not present

## 2022-01-20 DIAGNOSIS — K635 Polyp of colon: Secondary | ICD-10-CM | POA: Diagnosis not present

## 2022-01-20 DIAGNOSIS — D125 Benign neoplasm of sigmoid colon: Secondary | ICD-10-CM

## 2022-01-20 MED ORDER — SODIUM CHLORIDE 0.9 % IV SOLN: 500.0000 mL | Freq: Once | INTRAVENOUS | Status: DC

## 2022-01-20 NOTE — Progress Notes (Signed)
PT taken to PACU. Monitors in place. VSS. Report given to RN. 

## 2022-01-20 NOTE — Progress Notes (Signed)
Pt's states no medical or surgical changes since previsit or office visit. 

## 2022-01-20 NOTE — Progress Notes (Signed)
[facility] History and Physical ? ? ?Primary Care Physician:  Horald Pollen, MD ? ? ?Reason for Procedure:   Colon cancer screening  ? ?Plan:    colonoscopy ? ? ? ? ?HPI: Mackenzie Floyd is a 51 y.o. female  here for colonoscopy screening - first time exam. Patient denies any bowel symptoms at this time other than baseline constipation at times. No family history of colon cancer known. Otherwise feels well without any cardiopulmonary symptoms.  ? ? ?History reviewed. No pertinent past medical history. Healthy ? ?Past Surgical History:  ?Procedure Laterality Date  ? [place] and 2008  ? Phreesia 03/04/2020  ? TONSILLECTOMY  1985  ? ? ?Prior to Admission medications   ?Medication Sig Start Date End Date Taking? Authorizing Provider  ?Magnesium Hydroxide (MAGNESIA PO) Take 1 tablet by mouth daily.   Yes [provider]  ?VITAMIN D PO Take 2,000 mg by mouth daily.   Yes [provider]  ?Wheat Dextrin (BENEFIBER PO) Take 1 tablet by mouth daily at 6 (six) AM. weekdays   Yes [provider]  ? ? ?Current Outpatient Medications  ?Medication Sig Dispense Refill  ? Magnesium Hydroxide (MAGNESIA PO) Take 1 tablet by mouth daily.    ? VITAMIN D PO Take 2,000 mg by mouth daily.    ? Wheat Dextrin (BENEFIBER PO) Take 1 tablet by mouth daily at 6 (six) AM. weekdays    ? ?Current Facility-Administered Medications  ?Medication Dose Route Frequency Provider Last Rate Last Admin  ? 0.9 %  sodium chloride infusion  500 mL Intravenous Once Hser Belanger, Carlota Raspberry, MD      ? ? ?Allergies as of 01/20/2022  ? (No Known Allergies)  ? ? ?Family History  ?Problem Relation Age of Onset  ? Colon polyps Father 24  ? Stroke Father   ? Hypertension Father   ? Colon cancer Neg Hx   ? Esophageal cancer Neg Hx   ? Prostate cancer Neg Hx   ? Rectal cancer Neg Hx   ? Stomach cancer Neg Hx   ? ? ?Social History  ? ?Socioeconomic History  ? Marital status: Married  ?  Spouse name: Not on file   ? Number of children: 2  ? Years of education: Not on file  ? Highest education level: Not on file  ?Occupational History  ? Not on file  ?Tobacco Use  ? Smoking status: Never  ? Smokeless tobacco: Never  ?Vaping Use  ? Vaping Use: Never used  ?Substance and Sexual Activity  ? Alcohol use: Not Currently  ?  Alcohol/week: 0.0 - 2.0 standard drinks  ?  Comment: socially beer and wine  ? Drug use: Never  ? Sexual activity: Not on file  ?Other Topics Concern  ? Not on file  ?Social History Narrative  ? Not on file  ? ?Social Determinants of Health  ? ?Financial Resource Strain: Not on file  ?Food Insecurity: Not on file  ?Transportation Needs: Not on file  ?Physical Activity: Not on file  ?Stress: Not on file  ?Social Connections: Not on file  ?Intimate Partner Violence: Not on file  ? ? ?Review of Systems: ?All other review of systems negative except as mentioned in the HPI. ? ?Physical Exam: ?Vital signs ?BP 118/66   Pulse 65   Temp (!) 97.3 ?F (36.3 ?C) (Temporal)   Ht '5\' 5"'$  (1.651 m)   Wt 138 lb (62.6 kg)   LMP 01/11/2022 (Exact Date)  SpO2 100%   BMI 22.96 kg/m?  ? ?General:   Alert,  Well-developed, pleasant and cooperative in NAD ?Lungs:  Clear throughout to auscultation.   ?Heart:  Regular rate and rhythm ?Abdomen:  Soft, nontender and nondistended.   ?Neuro/Psych:  Alert and cooperative. Normal mood and affect. A and O x 3 ? ?Jolly Mango, MD ?[facility] ? ? ?

## 2022-01-20 NOTE — Patient Instructions (Signed)
Handout on polyps  provided  ? ?Await pathology results.  ? ?Continue current medications ? ? ?YOU HAD AN ENDOSCOPIC PROCEDURE TODAY AT [facility]:   Refer to the procedure report that was given to you for any specific questions about what was found during the examination.  If the procedure report does not answer your questions, please call your gastroenterologist to clarify.  If you requested that your care partner not be given the details of your procedure findings, then the procedure report has been included in a sealed envelope for you to review at your convenience later. ? ?YOU SHOULD EXPECT: Some feelings of bloating in the abdomen. Passage of more gas than usual.  Walking can help get rid of the air that was put into your GI tract during the procedure and reduce the bloating. If you had a lower endoscopy (such as a colonoscopy or flexible sigmoidoscopy) you may notice spotting of blood in your stool or on the toilet paper. If you underwent a bowel prep for your procedure, you may not have a normal bowel movement for a few days. ? ?Please Note:  You might notice some irritation and congestion in your nose or some drainage.  This is from the oxygen used during your procedure.  There is no need for concern and it should clear up in a day or so. ? ?SYMPTOMS TO REPORT IMMEDIATELY: ? ?Following lower endoscopy (colonoscopy or flexible sigmoidoscopy): ? Excessive amounts of blood in the stool ? Significant tenderness or worsening of abdominal pains ? Swelling of the abdomen that is new, acute ? Fever of 100?F or higher ? ? ?For urgent or emergent issues, a gastroenterologist can be reached at any hour by calling [phone]. ?Do not use MyChart messaging for urgent concerns.  ? ? ?DIET:  We do recommend a small meal at first, but then you may proceed to your regular diet.  Drink plenty of fluids but you should avoid alcoholic beverages for 24 hours. ? ?ACTIVITY:  You should plan to take it easy  for the rest of today and you should NOT DRIVE or use heavy machinery until tomorrow (because of the sedation medicines used during the test).   ? ?FOLLOW UP: ?Our staff will call the number listed on your records 48-72 hours following your procedure to check on you and address any questions or concerns that you may have regarding the information given to you following your procedure. If we do not reach you, we will leave a message.  We will attempt to reach you two times.  During this call, we will ask if you have developed any symptoms of COVID 19. If you develop any symptoms (ie: fever, flu-like symptoms, shortness of breath, cough etc.) before then, please call [phone].  If you test positive for Covid 19 in the 2 weeks post procedure, please call and report this information to [place].   ? ?If any biopsies were taken you will be contacted by phone or by letter within the next 1-3 weeks.  Please call us at [phone] if you have not heard about the biopsies in 3 weeks.  ? ? ?SIGNATURES/CONFIDENTIALITY: ?You and/or your care partner have signed paperwork which will be entered into your electronic medical record.  These signatures attest to the fact that that the information above on your After Visit Summary has been reviewed and is understood.  Full responsibility of the confidentiality of this discharge information lies with you and/or your care-partner. ? ? ?

## 2022-01-20 NOTE — Progress Notes (Signed)
Called to room to assist during endoscopic procedure.  Patient ID and intended procedure confirmed with present staff. Received instructions for my participation in the procedure from the performing physician.  

## 2022-01-20 NOTE — Op Note (Signed)
[facility] ?Patient Name: Mackenzie Floyd ?Procedure Date: 01/20/2022 11:16 AM ?MRN: [id number] ?Endoscopist: Carlota Raspberry. Havery Moros , MD ?Age: 51 ?Referring MD:  ?Date of Birth: 1971/03/06 ?Gender: Female ?Account #: [id number] ?Procedure:                Colonoscopy ?Indications:              Screening for colorectal malignant neoplasm, This  ?                          is the patient's first colonoscopy ?Medicines:                Monitored Anesthesia Care ?Procedure:                Pre-Anesthesia Assessment: ?                          - Prior to the procedure, a History and Physical  ?                          was performed, and patient medications and  ?                          allergies were reviewed. The patient's tolerance of  ?                          previous anesthesia was also reviewed. The risks  ?                          and benefits of the procedure and the sedation  ?                          options and risks were discussed with the patient.  ?                          All questions were answered, and informed consent  ?                          was obtained. Prior Anticoagulants: The patient has  ?                          taken no previous anticoagulant or antiplatelet  ?                          agents. ASA Grade Assessment: I - A normal, healthy  ?                          patient. After reviewing the risks and benefits,  ?                          the patient was deemed in satisfactory condition to  ?                          undergo the procedure. ?  After obtaining informed consent, the colonoscope  ?                          was passed under direct vision. Throughout the  ?                          procedure, the patient's blood pressure, pulse, and  ?                          oxygen saturations were monitored continuously. The  ?                          Olympus PCF-H190DL ([id number]) Colonoscope was  ?                          introduced through the anus and  advanced to the the  ?                          cecum, identified by appendiceal orifice and  ?                          ileocecal valve. The colonoscopy was technically  ?                          difficult and complex due to a tortuous colon. The  ?                          patient tolerated the procedure well. The quality  ?                          of the bowel preparation was adequate. The  ?                          ileocecal valve, appendiceal orifice, and rectum  ?                          were photographed. ?Scope In: 11:28:14 AM ?Scope Out: 11:53:17 AM ?Scope Withdrawal Time: 0 hours 14 minutes 5 seconds  ?Total Procedure Duration: 0 hours 25 minutes 3 seconds  ?Findings:                 The perianal and digital rectal examinations were  ?                          normal. ?                          A 30m flat polyp was found in the sigmoid colon.  ?                          The polyp was removed with a cold snare. Resection  ?                          and retrieval were complete. ?  The colon was extremely tortuous, cecal intubation  ?                          was technically challenging. ?                          The exam was otherwise without abnormality. ?Complications:            No immediate complications. Estimated blood loss:  ?                          Minimal. ?Estimated Blood Loss:     Estimated blood loss was minimal. ?Impression:               - One diminutive polyp in the sigmoid colon,  ?                          removed with a cold snare. Resected and retrieved. ?                          - Tortuous colon. ?                          - The examination was otherwise normal. ?Recommendation:           - Patient has a contact number available for  ?                          emergencies. The signs and symptoms of potential  ?                          delayed complications were discussed with the  ?                          patient. Return to normal activities tomorrow.  ?                           Written discharge instructions were provided to the  ?                          patient. ?                          - Resume previous diet. ?                          - Continue present medications. ?                          - Await pathology results. ?Carlota Raspberry. Deaken Jurgens, MD ?01/20/2022 11:56:53 AM ?This report has been signed electronically. ?

## 2022-01-22 ENCOUNTER — Telehealth: Payer: Self-pay

## 2022-01-22 NOTE — Telephone Encounter (Signed)
?  Follow up Call- ? ? ?  01/20/2022  ? 10:49 AM  ?Call back number  ?Post procedure Call Back phone  # [phone]  ?Permission to leave phone message Yes  ?  ? ?Patient questions: ? ?Do you have a fever, pain , or abdominal swelling? No. ?Pain Score  0 * ? ?Have you tolerated food without any problems? Yes.   ? ?Have you been able to return to your normal activities? Yes.   ? ?Do you have any questions about your discharge instructions: ?Diet   No. ?Medications  No. ?Follow up visit  No. ? ?Do you have questions or concerns about your Care? No. ? ?Actions: ?* If pain score is 4 or above: ?No action needed, pain <4. ? ? ?

## 2022-02-18 ENCOUNTER — Encounter: Payer: Self-pay | Admitting: Emergency Medicine

## 2022-02-18 ENCOUNTER — Ambulatory Visit (INDEPENDENT_AMBULATORY_CARE_PROVIDER_SITE_OTHER): Payer: 59 | Admitting: Emergency Medicine

## 2022-02-18 VITALS — BP 120/68 | HR 64 | Temp 97.6°F | Ht 65.0 in | Wt 146.0 lb

## 2022-02-18 DIAGNOSIS — B351 Tinea unguium: Secondary | ICD-10-CM | POA: Diagnosis not present

## 2022-02-18 MED ORDER — TERBINAFINE HCL 250 MG PO TABS: 250.0000 mg | ORAL_TABLET | Freq: Every day | ORAL | 1 refills | Status: AC

## 2022-02-18 NOTE — Progress Notes (Signed)
Mackenzie Floyd 51 y.o.   Chief Complaint  Patient presents with   toenail fungus    HISTORY OF PRESENT ILLNESS: This is a 51 y.o. female complaining of toenail fungus for at least 2 years. Has tried several topical medications without success. Denies injury. No chronic medical problems.  No history of diabetes. No other complaints or medical concerns today.  HPI   Prior to Admission medications   Medication Sig Start Date End Date Taking? Authorizing Provider  Magnesium Hydroxide (MAGNESIA PO) Take 1 tablet by mouth daily.   Yes [provider]  VITAMIN D PO Take 2,000 mg by mouth daily.   Yes [provider]  Wheat Dextrin (BENEFIBER PO) Take 1 tablet by mouth daily at 6 (six) AM. weekdays   Yes [provider]    No Known Allergies  There are no problems to display for this patient.   No past medical history on file.  Past Surgical History:  Procedure Laterality Date   CESAREAN SECTION N/A 1997 and 2008   Phreesia 03/04/2020   TONSILLECTOMY  1985    Social History   Socioeconomic History   Marital status: Married    Spouse name: Not on file   Number of children: 2   Years of education: Not on file   Highest education level: Not on file  Occupational History   Not on file  Tobacco Use   Smoking status: Never   Smokeless tobacco: Never  Vaping Use   Vaping Use: Never used  Substance and Sexual Activity   Alcohol use: Not Currently    Alcohol/week: 0.0 - 2.0 standard drinks of alcohol    Comment: socially beer and wine   Drug use: Never   Sexual activity: Not on file  Other Topics Concern   Not on file  Social History Narrative   Not on file   Social Determinants of Health   Financial Resource Strain: Not on file  Food Insecurity: Not on file  Transportation Needs: Not on file  Physical Activity: Not on file  Stress: Not on file  Social Connections: Not on file  Intimate Partner Violence: Not on file    Family History   Problem Relation Age of Onset   Colon polyps Father 60   Stroke Father    Hypertension Father    Colon cancer Neg Hx    Esophageal cancer Neg Hx    Prostate cancer Neg Hx    Rectal cancer Neg Hx    Stomach cancer Neg Hx      Review of Systems  Constitutional: Negative.  Negative for chills and fever.  HENT: Negative.  Negative for congestion and sore throat.   Respiratory: Negative.  Negative for cough and shortness of breath.   Cardiovascular: Negative.  Negative for chest pain and palpitations.  Gastrointestinal:  Negative for abdominal pain, nausea and vomiting.  Skin: Negative.  Negative for rash.  Neurological: Negative.  Negative for dizziness and headaches.  All other systems reviewed and are negative.  Today's Vitals   02/18/22 0955  BP: 120/68  Pulse: 64  Temp: 97.6 F (36.4 C)  TempSrc: Oral  SpO2: 97%  Weight: 146 lb (66.2 kg)  Height: '5\' 5"'$  (1.651 m)   Body mass index is 24.3 kg/m.   Physical Exam Vitals reviewed.  Constitutional:      Appearance: Normal appearance.  HENT:     Head: Normocephalic.  Cardiovascular:     Rate and Rhythm: Normal rate.  Pulmonary:  Effort: Pulmonary effort is normal.  Skin:    General: Skin is warm and dry.     Comments: Onychomycosis left big toe  Neurological:     Mental Status: She is alert and oriented to person, place, and time.  Psychiatric:        Mood and Affect: Mood normal.        Behavior: Behavior normal.      ASSESSMENT & PLAN: Problem List Items Addressed This Visit   None Visit Diagnoses     Onychomycosis of left great toe    -  Primary   Relevant Medications   terbinafine (LAMISIL) 250 MG tablet   Other Relevant Orders   Ambulatory referral to Podiatry      Patient Instructions  Infeccin por hongos en las uas Fungal Nail Infection La infeccin por hongos en las uas es una infeccin frecuente de las uas de los pies o de las manos. Este trastorno [employer] uas de los pies con  ms frecuencia que las uas de las manos. Generalmente afecta al dedo gordo del pie. Ms de Ardelia Mems ua puede infectarse. Esta afeccin puede transmitirse de [place] persona a otra (es contagiosa). Cules son las causas? La causa de esta afeccin es un hongo, como las [place]. Son varios los tipos de hongos que pueden causar la infeccin. Estos hongos son frecuentes en las zonas hmedas y clidas. Si las manos o los pies entran en contacto con los hongos, se pueden introducir en una grieta de las uas de las manos o de los pies o en la piel de alrededor y Photographer una infeccin. Qu incrementa el riesgo? Los siguientes factores pueden hacer que sea ms propenso a Armed forces training and education officer afeccin: Ser una persona de edad avanzada. Tener ciertas afecciones, por ejemplo: Pie de atleta. Diabetes. Mala circulacin. Debilitamiento del sistema de defensa del organismo (sistema inmunitario). Caminar descalzo en zonas donde proliferan hongos, como duchas o vestuarios. Usar zapatos y calcetines que [employer]. Tener una ua lastimada o haberse sometido a una ciruga de uas recientemente. Cules son los signos o sntomas? Los sntomas de esta afeccin incluyen: Una mancha plida sobre la ua. Engrosamiento de la ua. Una ua que se torna amarilla, marrn o blanca. Virgil uas rugosos o quebradizos. Una ua que se ha desprendido del lecho ungueal. Cmo se diagnostica? Esta afeccin se diagnostica mediante un examen fsico. El mdico podr tomar una muestra de la ua para examinarla y Hydrographic surveyor si tiene hongos. Cmo se trata? No es necesario realizar tratamiento si la infeccin es leve. Si tiene [employer] uas, el tratamiento puede incluir lo siguiente: Antimicticos que se toman por boca (va oral). Deber tomar los medicamentos durante algunas semanas o meses y no ver los resultados hasta despus de un [place]. Estos medicamentos pueden tener efectos  secundarios. Consulte al [employer] a los que debe estar atento. Cremas o esmaltes de uas antimicticos. Se pueden usar junto con los medicamentos antimicticos que se administran por va oral. Tratamiento lser de las uas. Ciruga para extirpar la ua. Esto puede ser [employer] casos ms graves de infecciones. La infeccin puede tardar un largo tiempo en desaparecer, habitualmente hasta un ao. Adems, la infeccin puede regresar. Siga estas indicaciones en su casa: Medicamentos Use o aplquese los medicamentos de venta libre y los recetados solamente como se lo haya indicado el mdico. Consulte al mdico sobre el uso de pomadas mentoladas para las uas  de [employer]. [place] uas con Camera operator. Lvese y [place] y los pies todos [place]. Mantener los pies secos. Para hacer esto: Use calcetines absorbentes y cmbiese los calcetines con frecuencia. Use un tipo de calzado que permita que el aire [place], como sandalias o zapatillas de lona. Deseche los zapatos viejos. Si va al saln de esttica de uas, asegrese de elegir uno en el que se usen instrumentos limpios. Aplquese polvo antimictico en los pies y en los zapatos. Indicaciones generales No comparta elementos personales como toallas o cortauas. No camine descalzo en duchas o vestuarios. Use guantes de goma si est trabajando con sus manos en lugares mojados. Concurra a [place]. Esto es importante. Comunquese con un mdico si: Tiene enrojecimiento, dolor o pus cerca de la ua del pie o de la [place]. La infeccin no mejora o la infeccin empeora despus de varios meses. Tiene ms problemas de circulacin cerca de la ua del pie o de la [place]. La ua se pone de color marrn o negro y ese color se extiende a la piel circundante. Resumen La infeccin por hongos en las uas es una infeccin frecuente de las uas de los pies o de las manos. No es  necesario realizar tratamiento si la infeccin es leve. Si tiene [employer] uas, el tratamiento puede incluir la administracin de medicamentos por va oral y la aplicacin de un medicamento en las uas. La infeccin puede tardar un largo tiempo en desaparecer, habitualmente hasta un ao. Adems, la infeccin puede regresar. Use o aplquese los medicamentos de venta libre y los recetados solamente como se lo haya indicado el mdico. Esta informacin no tiene Marine scientist el consejo del mdico. Asegrese de hacerle al mdico cualquier pregunta que tenga. Document Revised: 12/30/2020 Document Reviewed: 12/30/2020 Elsevier Patient Education  [place], MD [facility]

## 2022-02-18 NOTE — Patient Instructions (Signed)
Infeccin por hongos en las uas Fungal Nail Infection La infeccin por hongos en las uas es una infeccin frecuente de las uas de los pies o de las manos. Este trastorno [employer] uas de los pies con ms frecuencia que las uas de las manos. Generalmente afecta al dedo gordo del pie. Ms de Ardelia Mems ua puede infectarse. Esta afeccin puede transmitirse de [place] persona a otra (es contagiosa). Cules son las causas? La causa de esta afeccin es un hongo, como las [place]. Son varios los tipos de hongos que pueden causar la infeccin. Estos hongos son frecuentes en las zonas hmedas y clidas. Si las manos o los pies entran en contacto con los hongos, se pueden introducir en una grieta de las uas de las manos o de los pies o en la piel de alrededor y Photographer una infeccin. Qu incrementa el riesgo? Los siguientes factores pueden hacer que sea ms propenso a Armed forces training and education officer afeccin: Ser una persona de edad avanzada. Tener ciertas afecciones, por ejemplo: Pie de atleta. Diabetes. Mala circulacin. Debilitamiento del sistema de defensa del organismo (sistema inmunitario). Caminar descalzo en zonas donde proliferan hongos, como duchas o vestuarios. Usar zapatos y calcetines que [employer]. Tener una ua lastimada o haberse sometido a una ciruga de uas recientemente. Cules son los signos o sntomas? Los sntomas de esta afeccin incluyen: Una mancha plida sobre la ua. Engrosamiento de la ua. Una ua que se torna amarilla, marrn o blanca. [place] uas rugosos o quebradizos. Una ua que se ha desprendido del lecho ungueal. Cmo se diagnostica? Esta afeccin se diagnostica mediante un examen fsico. El mdico podr tomar una muestra de la ua para examinarla y Hydrographic surveyor si tiene hongos. Cmo se trata? No es necesario realizar tratamiento si la infeccin es leve. Si tiene [employer] uas, el tratamiento puede incluir lo  siguiente: Antimicticos que se toman por boca (va oral). Deber tomar los medicamentos durante algunas semanas o meses y no ver los resultados hasta despus de un [place]. Estos medicamentos pueden tener efectos secundarios. Consulte al [employer] a los que debe estar atento. [place] o esmaltes de uas antimicticos. Se pueden usar junto con los medicamentos antimicticos que se administran por va oral. Tratamiento lser de las uas. Ciruga para extirpar la ua. Esto puede ser [employer] casos ms graves de infecciones. La infeccin puede tardar un largo tiempo en desaparecer, habitualmente hasta un ao. Adems, la infeccin puede regresar. Siga estas indicaciones en su casa: Medicamentos Use o aplquese los medicamentos de venta libre y los recetados solamente como se lo haya indicado el mdico. Consulte al mdico sobre el uso de pomadas mentoladas para las uas de [place]. [place] uas con Camera operator. Lvese y [place] y los pies todos [place]. Mantener los pies secos. Para hacer esto: Use calcetines absorbentes y cmbiese los calcetines con frecuencia. Use un tipo de calzado que permita que el aire [place], como sandalias o zapatillas de lona. Deseche los zapatos viejos. Si va al saln de esttica de uas, asegrese de elegir uno en el que se usen instrumentos limpios. Aplquese polvo antimictico en los pies y en los zapatos. Indicaciones generales No comparta elementos personales como toallas o cortauas. No camine descalzo en duchas o vestuarios. Use guantes de goma si est trabajando con sus manos en lugares mojados. Concurra a [place]. Esto es importante. Comunquese con un  mdico si: Tiene enrojecimiento, dolor o pus cerca de la ua del pie o de la [place]. La infeccin no mejora o la infeccin empeora despus de varios meses. Tiene ms problemas de circulacin cerca de la ua del pie o de la  [place]. La ua se pone de color marrn o negro y ese color se extiende a la piel circundante. Resumen La infeccin por hongos en las uas es una infeccin frecuente de las uas de los pies o de las manos. No es necesario realizar tratamiento si la infeccin es leve. Si tiene [employer] uas, el tratamiento puede incluir la administracin de medicamentos por va oral y la aplicacin de un medicamento en las uas. La infeccin puede tardar un largo tiempo en desaparecer, habitualmente hasta un ao. Adems, la infeccin puede regresar. Use o aplquese los medicamentos de venta libre y los recetados solamente como se lo haya indicado el mdico. Esta informacin no tiene Marine scientist el consejo del mdico. Asegrese de hacerle al mdico cualquier pregunta que tenga. Document Revised: 12/30/2020 Document Reviewed: 12/30/2020 Elsevier Patient Education  [place].

## 2022-03-04 ENCOUNTER — Ambulatory Visit: Payer: 59 | Admitting: Podiatry

## 2022-03-04 ENCOUNTER — Other Ambulatory Visit (INDEPENDENT_AMBULATORY_CARE_PROVIDER_SITE_OTHER): Payer: 59 | Admitting: Podiatry

## 2022-03-04 DIAGNOSIS — Z79899 Other long term (current) drug therapy: Secondary | ICD-10-CM

## 2022-03-04 DIAGNOSIS — B351 Tinea unguium: Secondary | ICD-10-CM

## 2022-03-05 LAB — HEPATIC FUNCTION PANEL
ALT: 12 IU/L (ref 0–32)
AST: 20 IU/L (ref 0–40)
Albumin: 4.4 g/dL (ref 3.8–4.8)
Alkaline Phosphatase: 82 IU/L (ref 44–121)
Bilirubin Total: 1.1 mg/dL (ref 0.0–1.2)
Bilirubin, Direct: 0.25 mg/dL (ref 0.00–0.40)
Total Protein: 7.2 g/dL (ref 6.0–8.5)

## 2022-03-05 LAB — SPECIMEN STATUS REPORT

## 2022-03-05 MED ORDER — TERBINAFINE HCL 250 MG PO TABS: 250.0000 mg | ORAL_TABLET | Freq: Every day | ORAL | 0 refills | Status: DC

## 2022-03-10 NOTE — Progress Notes (Signed)
  Subjective:  Patient ID: Mackenzie Floyd, female    DOB: [birth year],  MRN: [id number]  Chief Complaint  Patient presents with   Nail Problem    Nail discoloration  Pt is already taking Lamisil     51 y.o. female presents with the above complaint.  Patient presents with thickened elongated dystrophic toenails.  Patient states that she has fungal in the nail.  She wanted to get it evaluated.  She is already on Lamisil therapy and has gotten 30-day supply from her primary care physician.  She wanted to follow-up here to get more.  She has not seen anyone else prior to seeing me.  She denies any other acute issues.   Review of Systems: Negative except as noted in the HPI. Denies N/V/F/Ch.  No past medical history on file.  Current Outpatient Medications:    Magnesium Hydroxide (MAGNESIA PO), Take 1 tablet by mouth daily., Disp: , Rfl:    terbinafine (LAMISIL) 250 MG tablet, Take 1 tablet (250 mg total) by mouth daily., Disp: 30 tablet, Rfl: 1   terbinafine (LAMISIL) 250 MG tablet, Take 1 tablet (250 mg total) by mouth daily., Disp: 60 tablet, Rfl: 0   VITAMIN D PO, Take 2,000 mg by mouth daily., Disp: , Rfl:    Wheat Dextrin (BENEFIBER PO), Take 1 tablet by mouth daily at 6 (six) AM. weekdays, Disp: , Rfl:   Social History   Tobacco Use  Smoking Status Never  Smokeless Tobacco Never    No Known Allergies Objective:  There were no vitals filed for this visit. There is no height or weight on file to calculate BMI. Constitutional Well developed. Well nourished.  Vascular Dorsalis pedis pulses palpable bilaterally. Posterior tibial pulses palpable bilaterally. Capillary refill normal to all digits.  No cyanosis or clubbing noted. Pedal hair growth normal.  Neurologic Normal speech. Oriented to person, place, and time. Epicritic sensation to light touch grossly present bilaterally.  Dermatologic Nails thickened elongated dystrophic mycotic left hallux nail fungus noted to the  nail Skin within normal limits  Orthopedic: Normal joint ROM without pain or crepitus bilaterally. No visible deformities. No bony tenderness.   Radiographs: None Assessment:   1. Long-term use of high-risk medication   2. Nail fungus   3. Onychomycosis due to dermatophyte    Plan:  Patient was evaluated and treated and all questions answered.  Onychomycosis left hallux -Educated the patient on the etiology of onychomycosis and various treatment options associated with improving the fungal load.  I explained to the patient that there is 3 treatment options available to treat the onychomycosis including topical, p.o., laser treatment.  Patient elected to undergo p.o. options with Lamisil/terbinafine therapy.  In order for me to start the medication therapy, I explained to the patient the importance of evaluating the liver and obtaining the liver function test.  Once the liver function test comes back normal I will start him on 70-monthcourse of Lamisil therapy.  Patient understood all risk and would like to proceed with Lamisil therapy.  I have asked the patient to immediately stop the Lamisil therapy if she has any reactions to it and call the office or go to the emergency room right away.  Patient states understanding -She already received 30 days from her primary care physician   No follow-ups on file.

## 2022-07-07 ENCOUNTER — Ambulatory Visit: Payer: 59 | Admitting: Podiatry

## 2022-07-07 ENCOUNTER — Other Ambulatory Visit: Payer: Self-pay | Admitting: Podiatry

## 2022-07-07 DIAGNOSIS — Z79899 Other long term (current) drug therapy: Secondary | ICD-10-CM | POA: Diagnosis not present

## 2022-07-07 DIAGNOSIS — B351 Tinea unguium: Secondary | ICD-10-CM

## 2022-07-07 NOTE — Progress Notes (Signed)
  Subjective:  Patient ID: Mackenzie Floyd, female    DOB: September 30, 1970,  MRN: [id number]  Chief Complaint  Patient presents with   Nail Problem    Pt stated she is here for a nail fungus follow up, she stated that her nail is a lot better     51 y.o. female presents with the above complaint.  Patient presents for follow-up of onychomycosis dystrophic nail to the left hallux.  She states is improving a lot.  Lamisil has been helping.  She would like to know if she can do another round.   Review of Systems: Negative except as noted in the HPI. Denies N/V/F/Ch.  No past medical history on file.  Current Outpatient Medications:    Magnesium Hydroxide (MAGNESIA PO), Take 1 tablet by mouth daily., Disp: , Rfl:    terbinafine (LAMISIL) 250 MG tablet, Take 1 tablet (250 mg total) by mouth daily., Disp: 60 tablet, Rfl: 0   VITAMIN D PO, Take 2,000 mg by mouth daily., Disp: , Rfl:    Wheat Dextrin (BENEFIBER PO), Take 1 tablet by mouth daily at 6 (six) AM. weekdays, Disp: , Rfl:   Social History   Tobacco Use  Smoking Status Never  Smokeless Tobacco Never    No Known Allergies Objective:  There were no vitals filed for this visit. There is no height or weight on file to calculate BMI. Constitutional Well developed. Well nourished.  Vascular Dorsalis pedis pulses palpable bilaterally. Posterior tibial pulses palpable bilaterally. Capillary refill normal to all digits.  No cyanosis or clubbing noted. Pedal hair growth normal.  Neurologic Normal speech. Oriented to person, place, and time. Epicritic sensation to light touch grossly present bilaterally.  Dermatologic Nails thickened elongated dystrophic mycotic left hallux nail fungus noted to the nail which is improving Skin within normal limits  Orthopedic: Normal joint ROM without pain or crepitus bilaterally. No visible deformities. No bony tenderness.   Radiographs: None Assessment:   1. Onychomycosis of left great toe   2. Nail  fungus   3. Long-term use of high-risk medication    Plan:  Patient was evaluated and treated and all questions answered.  Onychomycosis left hallux~second round  -Educated the patient on the etiology of onychomycosis and various treatment options associated with improving the fungal load.  I explained to the patient that there is 3 treatment options available to treat the onychomycosis including topical, p.o., laser treatment.  Patient elected to undergo p.o. options with Lamisil/terbinafine therapy.  In order for me to start the set round of medication therapy, I explained to the patient the importance of evaluating the liver and obtaining the liver function test.  Once the liver function test comes back normal I will start her on second round of n 40-monthcourse of Lamisil therapy.  Patient understood all risk and would like to proceed with Lamisil therapy.  I have asked the patient to immediately stop the Lamisil therapy if she has any reactions to it and call the office or go to the emergency room right away.  Patient states understanding -We will do a second round of Lamisil  No follow-ups on file.

## 2022-07-08 LAB — HEPATIC FUNCTION PANEL
ALT: 12 IU/L (ref 0–32)
AST: 21 IU/L (ref 0–40)
Albumin: 4.6 g/dL (ref 3.8–4.9)
Alkaline Phosphatase: 94 IU/L (ref 44–121)
Bilirubin Total: 1 mg/dL (ref 0.0–1.2)
Bilirubin, Direct: 0.3 mg/dL (ref 0.00–0.40)
Total Protein: 7.3 g/dL (ref 6.0–8.5)

## 2022-07-08 MED ORDER — TERBINAFINE HCL 250 MG PO TABS: 250.0000 mg | ORAL_TABLET | Freq: Every day | ORAL | 0 refills | Status: DC

## 2022-07-08 NOTE — Addendum Note (Signed)
Addended by: Boneta Lucks on: 07/08/2022 10:08 AM   Modules accepted: Orders

## 2022-07-22 DIAGNOSIS — Z1231 Encounter for screening mammogram for malignant neoplasm of breast: Secondary | ICD-10-CM | POA: Diagnosis not present

## 2022-07-22 DIAGNOSIS — Z6824 Body mass index (BMI) 24.0-24.9, adult: Secondary | ICD-10-CM | POA: Diagnosis not present

## 2022-07-22 DIAGNOSIS — Z01419 Encounter for gynecological examination (general) (routine) without abnormal findings: Secondary | ICD-10-CM | POA: Diagnosis not present

## 2022-07-26 ENCOUNTER — Other Ambulatory Visit: Payer: Self-pay | Admitting: Obstetrics and Gynecology

## 2022-07-26 DIAGNOSIS — R928 Other abnormal and inconclusive findings on diagnostic imaging of breast: Secondary | ICD-10-CM

## 2022-08-12 ENCOUNTER — Ambulatory Visit: Payer: 59

## 2022-08-12 ENCOUNTER — Other Ambulatory Visit: Payer: Self-pay | Admitting: Emergency Medicine

## 2022-08-12 ENCOUNTER — Ambulatory Visit
Admission: RE | Admit: 2022-08-12 | Discharge: 2022-08-12 | Disposition: A | Discharge status: Home / Self Care | Payer: 59 | Source: Ambulatory Visit | Attending: Obstetrics and Gynecology | Admitting: Obstetrics and Gynecology

## 2022-08-12 DIAGNOSIS — Z1231 Encounter for screening mammogram for malignant neoplasm of breast: Secondary | ICD-10-CM

## 2022-08-12 DIAGNOSIS — R92332 Mammographic heterogeneous density, left breast: Secondary | ICD-10-CM | POA: Diagnosis not present

## 2022-08-12 DIAGNOSIS — R928 Other abnormal and inconclusive findings on diagnostic imaging of breast: Secondary | ICD-10-CM

## 2022-08-12 DIAGNOSIS — R1031 Right lower quadrant pain: Secondary | ICD-10-CM | POA: Diagnosis not present

## 2022-08-31 ENCOUNTER — Encounter: Payer: 59 | Admitting: Emergency Medicine

## 2022-09-02 ENCOUNTER — Encounter: Payer: 59 | Admitting: Emergency Medicine

## 2022-09-16 ENCOUNTER — Ambulatory Visit (INDEPENDENT_AMBULATORY_CARE_PROVIDER_SITE_OTHER): Payer: 59 | Admitting: Emergency Medicine

## 2022-09-16 ENCOUNTER — Encounter: Payer: Self-pay | Admitting: Emergency Medicine

## 2022-09-16 VITALS — BP 112/84 | HR 79 | Temp 98.3°F | Ht 65.0 in | Wt 149.2 lb

## 2022-09-16 DIAGNOSIS — Z0000 Encounter for general adult medical examination without abnormal findings: Secondary | ICD-10-CM | POA: Diagnosis not present

## 2022-09-16 DIAGNOSIS — Z130 Encounter for screening for diseases of the blood and blood-forming organs and certain disorders involving the immune mechanism: Secondary | ICD-10-CM

## 2022-09-16 DIAGNOSIS — Z1329 Encounter for screening for other suspected endocrine disorder: Secondary | ICD-10-CM | POA: Diagnosis not present

## 2022-09-16 DIAGNOSIS — Z23 Encounter for immunization: Secondary | ICD-10-CM

## 2022-09-16 DIAGNOSIS — Z13220 Encounter for screening for lipoid disorders: Secondary | ICD-10-CM | POA: Diagnosis not present

## 2022-09-16 DIAGNOSIS — Z13228 Encounter for screening for other metabolic disorders: Secondary | ICD-10-CM

## 2022-09-16 LAB — COMPREHENSIVE METABOLIC PANEL
ALT: 14 U/L (ref 0–35)
AST: 23 U/L (ref 0–37)
Albumin: 4.3 g/dL (ref 3.5–5.2)
Alkaline Phosphatase: 85 U/L (ref 39–117)
BUN: 15 mg/dL (ref 6–23)
CO2: 29 mEq/L (ref 19–32)
Calcium: 9.7 mg/dL (ref 8.4–10.5)
Chloride: 102 mEq/L (ref 96–112)
Creatinine, Ser: 0.79 mg/dL (ref 0.40–1.20)
GFR: 86.62 mL/min (ref >60.00)
Glucose, Bld: 81 mg/dL (ref 70–99)
Potassium: 4.4 mEq/L (ref 3.5–5.1)
Sodium: 137 mEq/L (ref 135–145)
Total Bilirubin: 1.2 mg/dL (ref 0.2–1.2)
Total Protein: 7.7 g/dL (ref 6.0–8.3)

## 2022-09-16 LAB — CBC WITH DIFFERENTIAL/PLATELET
Basophils Absolute: 0.1 10*3/uL (ref 0.0–0.1)
Basophils Relative: 1.3 % (ref 0.0–3.0)
Eosinophils Absolute: 0.1 10*3/uL (ref 0.0–0.7)
Eosinophils Relative: 2.6 % (ref 0.0–5.0)
HCT: 40.7 % (ref 36.0–46.0)
Hemoglobin: 13.6 g/dL (ref 12.0–15.0)
Lymphocytes Relative: 24.8 % (ref 12.0–46.0)
Lymphs Abs: 1.1 10*3/uL (ref 0.7–4.0)
MCHC: 33.4 g/dL (ref 30.0–36.0)
MCV: 83.4 fl (ref 78.0–100.0)
Monocytes Absolute: 0.5 10*3/uL (ref 0.1–1.0)
Monocytes Relative: 11 % (ref 3.0–12.0)
Neutro Abs: 2.7 10*3/uL (ref 1.4–7.7)
Neutrophils Relative %: 60.3 % (ref 43.0–77.0)
Platelets: 189 10*3/uL (ref 150.0–400.0)
RBC: 4.88 Mil/uL (ref 3.87–5.11)
RDW: 13.9 % (ref 11.5–15.5)
WBC: 4.4 10*3/uL (ref 4.0–10.5)

## 2022-09-16 LAB — LIPID PANEL
Cholesterol: 210 mg/dL — ABNORMAL HIGH (ref 0–200)
HDL: 95.1 mg/dL (ref >39.00)
LDL Cholesterol: 103 mg/dL — ABNORMAL HIGH (ref 0–99)
NonHDL: 114.57
Total CHOL/HDL Ratio: 2
Triglycerides: 60 mg/dL (ref 0.0–149.0)
VLDL: 12 mg/dL (ref 0.0–40.0)

## 2022-09-16 LAB — HEMOGLOBIN A1C: Hgb A1c MFr Bld: 5.3 % (ref 4.6–6.5)

## 2022-09-16 NOTE — Patient Instructions (Signed)

## 2022-09-16 NOTE — Progress Notes (Signed)
Mackenzie Floyd 52 y.o.   Chief Complaint  Patient presents with   Annual Exam    Patient wants to go over her colonoscopy results.     HISTORY OF PRESENT ILLNESS: This is a 52 y.o. female here for annual exam. Recent normal mammogram and recent normal colonoscopy Reports reviewed with patient. Has no complaints or medical concerns today. Healthy female with a healthy lifestyle.  HPI   Prior to Admission medications   Medication Sig Start Date End Date Taking? Authorizing Provider  Magnesium Hydroxide (MAGNESIA PO) Take 1 tablet by mouth daily.   Yes [provider]  terbinafine (LAMISIL) 250 MG tablet Take 1 tablet (250 mg total) by mouth daily. 03/05/22  Yes Felipa Furnace, DPM  terbinafine (LAMISIL) 250 MG tablet Take 1 tablet (250 mg total) by mouth daily. 07/08/22  Yes Felipa Furnace, DPM  VITAMIN D PO Take 2,000 mg by mouth daily.   Yes [provider]  Wheat Dextrin (BENEFIBER PO) Take 1 tablet by mouth daily at 6 (six) AM. weekdays   Yes [provider]    No Known Allergies  There are no problems to display for this patient.   No past medical history on file.  Past Surgical History:  Procedure Laterality Date   CESAREAN SECTION N/A 1997 and 2008   Phreesia 03/04/2020   TONSILLECTOMY  1985    Social History   Socioeconomic History   Marital status: Married    Spouse name: Not on file   Number of children: 2   Years of education: Not on file   Highest education level: Not on file  Occupational History   Not on file  Tobacco Use   Smoking status: Never   Smokeless tobacco: Never  Vaping Use   Vaping Use: Never used  Substance and Sexual Activity   Alcohol use: Not Currently    Alcohol/week: 0.0 - 2.0 standard drinks of alcohol    Comment: socially beer and wine   Drug use: Never   Sexual activity: Not on file  Other Topics Concern   Not on file  Social History Narrative   Not on file   Social Determinants of Health    Financial Resource Strain: Not on file  Food Insecurity: Not on file  Transportation Needs: Not on file  Physical Activity: Not on file  Stress: Not on file  Social Connections: Not on file  Intimate Partner Violence: Not on file    Family History  Problem Relation Age of Onset   Colon polyps Father 52   Stroke Father    Hypertension Father    Colon cancer Neg Hx    Esophageal cancer Neg Hx    Prostate cancer Neg Hx    Rectal cancer Neg Hx    Stomach cancer Neg Hx      Review of Systems  Constitutional: Negative.  Negative for chills and fever.  HENT: Negative.  Negative for congestion and sore throat.   Respiratory: Negative.  Negative for cough and shortness of breath.   Cardiovascular: Negative.  Negative for chest pain and palpitations.  Gastrointestinal:  Negative for abdominal pain, diarrhea, nausea and vomiting.  Genitourinary: Negative.  Negative for dysuria and hematuria.  Skin: Negative.  Negative for rash.  Neurological: Negative.  Negative for dizziness and headaches.  All other systems reviewed and are negative.  Today's Vitals   09/16/22 0822  BP: 112/84  Pulse: 79  Temp: 98.3 F (36.8 C)  TempSrc: Oral  SpO2:  99%  Weight: 149 lb 4 oz (67.7 kg)  Height: '5\' 5"'$  (1.651 m)   Body mass index is 24.84 kg/m.   Physical Exam Vitals reviewed.  Constitutional:      Appearance: Normal appearance.  HENT:     Head: Normocephalic.     Right Ear: Tympanic membrane, ear canal and external ear normal.     Left Ear: Tympanic membrane, ear canal and external ear normal.     Mouth/Throat:     Mouth: Mucous membranes are moist.     Pharynx: Oropharynx is clear.  Eyes:     Extraocular Movements: Extraocular movements intact.     Conjunctiva/sclera: Conjunctivae normal.     Pupils: Pupils are equal, round, and reactive to light.  Cardiovascular:     Rate and Rhythm: Normal rate and regular rhythm.     Pulses: Normal pulses.     Heart sounds: Normal heart  sounds.  Pulmonary:     Effort: Pulmonary effort is normal.     Breath sounds: Normal breath sounds.  Musculoskeletal:     Cervical back: No tenderness.  Lymphadenopathy:     Cervical: No cervical adenopathy.  Skin:    General: Skin is warm and dry.  Neurological:     General: No focal deficit present.     Mental Status: She is alert and oriented to person, place, and time.  Psychiatric:        Mood and Affect: Mood normal.        Behavior: Behavior normal.      ASSESSMENT & PLAN: Problem List Items Addressed This Visit   None Visit Diagnoses     Routine general medical examination at a health care facility    -  Primary   Relevant Orders   CBC with Differential   Comprehensive metabolic panel   Hemoglobin A1c   Lipid panel   Screening for deficiency anemia       Relevant Orders   CBC with Differential   Screening for lipoid disorders       Relevant Orders   Lipid panel   Screening for endocrine, metabolic and immunity disorder       Relevant Orders   Comprehensive metabolic panel   Hemoglobin A1c   Need for vaccination       Relevant Orders   Flu Vaccine QUAD 6+ mos PF IM (Fluarix Quad PF)      Modifiable risk factors discussed with patient. Anticipatory guidance according to age provided. The following topics were also discussed: Social Determinants of Health Smoking.  Non-smoker Diet and nutrition.  Good eating habits Benefits of exercise.  Exercises regularly Cancer screening and review of recent colonoscopy and mammogram reports which are normal Vaccinations reviewed and recommendations Cardiovascular risk assessment The 10-year ASCVD risk score (Arnett DK, et al., 2019) is: 0.5%   Values used to calculate the score:     Age: 78 years     Sex: Female     Is Non-Hispanic African American: No     Diabetic: No     Tobacco smoker: No     Systolic Blood Pressure: 945 mmHg     Is BP treated: No     HDL Cholesterol: 90.8 mg/dL     Total Cholesterol:  186 mg/dL  Mental health including depression and anxiety Fall and accident prevention  Patient Instructions  Mantenimiento de Technical sales engineer en [place] Maintenance, Female Adoptar un estilo de vida saludable y recibir atencin preventiva son importantes para promover  la salud y [place]. Consulte al mdico sobre: El esquema adecuado para hacerse pruebas y exmenes peridicos. Cosas que puede hacer por su cuenta para prevenir enfermedades y [place] sano. Qu debo saber sobre la dieta, el peso y el ejercicio? Consuma una dieta saludable  Consuma una dieta que incluya muchas verduras, frutas, productos lcteos con bajo contenido de [place] y Advertising account planner. No consuma muchos alimentos ricos en grasas slidas, azcares agregados o sodio. Mantenga un peso saludable El ndice de masa muscular [facility] [place]) se [place] para identificar problemas de [place]. Proporciona una estimacin de la grasa corporal basndose en el peso y la altura. Su mdico puede ayudarle a Radiation protection practitioner [place] y a Scientist, forensic o Theatre manager un peso saludable. Haga ejercicio con regularidad Haga ejercicio con regularidad. Esta es una de las prcticas ms importantes que puede hacer por su salud. La [employer] de los adultos deben seguir estas pautas: Optometrist, al menos, 150 minutos de actividad fsica por semana. El ejercicio debe aumentar la frecuencia cardaca y Nature conservation officer transpirar (ejercicio de intensidad moderada). Hacer ejercicios de fortalecimiento por lo [employer] por semana. Agregue esto a su plan de ejercicio de intensidad moderada. Pase menos tiempo sentada. Incluso la actividad fsica ligera puede ser beneficiosa. Controle sus niveles de colesterol y lpidos en la sangre Comience a realizarse anlisis de lpidos y Research officer, trade union en la sangre a los 61 aos y luego reptalos cada 5 aos. Hgase controlar los niveles de colesterol con mayor frecuencia si: Sus niveles de lpidos y colesterol son altos. Es mayor de 41  aos. Presenta un alto riesgo de padecer enfermedades cardacas. Qu debo saber sobre las pruebas de deteccin del cncer? Segn su historia clnica y sus antecedentes familiares, es posible que deba realizarse pruebas de deteccin del cncer en diferentes edades. Esto puede incluir pruebas de deteccin de lo siguiente: Cncer de mama. Cncer de cuello uterino. Cncer colorrectal. Cncer de piel. Cncer de pulmn. Qu debo saber sobre la enfermedad cardaca, la diabetes y la hipertensin arterial? Presin arterial y enfermedad cardaca La hipertensin arterial causa enfermedades cardacas y [place] el riesgo de accidente cerebrovascular. Es ms probable que esto se manifieste en las personas que tienen lecturas de presin arterial alta o tienen sobrepeso. Hgase controlar la presin arterial: Cada 3 a 5 aos si tiene entre 18 y 11 aos. Todos los aos si es mayor de 40 aos. Diabetes Realcese exmenes de deteccin de la diabetes con regularidad. Este anlisis revisa el nivel de azcar en la sangre en [place]. Hgase las pruebas de deteccin: Cada tres aos despus de los 76 aos de edad si tiene un peso normal y un bajo riesgo de padecer diabetes. Con ms frecuencia y a partir de [place] edad inferior si tiene sobrepeso o un alto riesgo de padecer diabetes. Qu debo saber sobre la prevencin de infecciones? Hepatitis B Si tiene un riesgo ms alto de contraer hepatitis B, debe someterse a un examen de deteccin de este virus. Hable con el mdico para averiguar si tiene riesgo de contraer la infeccin por hepatitis B. Hepatitis C Se recomienda el anlisis a: [employer] 1945 y 1965. Todas las personas que tengan un riesgo de haber contrado hepatitis C. Enfermedades de transmisin sexual (ETS) Hgase las pruebas de Programme researcher, broadcasting/film/video de ITS, incluidas la gonorrea y la clamidia, si: Es sexualmente activa y es menor de 69 aos. Es mayor de 57 aos, y Investment banker, operational informa que corre  riesgo de tener este tipo de infecciones. Calla Kicks sexual  ha cambiado desde que le hicieron la ltima prueba de deteccin y tiene un riesgo mayor de Best boy clamidia o Radio broadcast assistant. Pregntele al mdico si usted tiene riesgo. Pregntele al mdico si usted tiene un alto riesgo de Museum/gallery curator VIH. El mdico tambin puede recomendarle un medicamento recetado para ayudar a evitar la infeccin por el VIH. Si elige tomar medicamentos para prevenir el VIH, primero debe [employer] de deteccin del VIH. Luego debe hacerse anlisis cada 3 meses mientras est tomando los medicamentos. Embarazo Si est por dejar de Librarian, academic (fase premenopusica) y usted puede quedar [place], busque asesoramiento antes de [place]. Tome de 400 a 800 microgramos (mcg) de cido [employer] si [place]. Pida mtodos de control de la natalidad (anticonceptivos) si desea evitar un embarazo no deseado. Osteoporosis y [place] La osteoporosis es una enfermedad en la que los huesos pierden los minerales y la fuerza por el avance de la edad. El resultado pueden ser fracturas en los [place]. Si tiene 51 aos o ms, o si est en riesgo de sufrir osteoporosis y fracturas, pregunte a su mdico si debe: Hacerse pruebas de deteccin de prdida sea. Tomar un suplemento de calcio o de vitamina D para reducir el riesgo de fracturas. Recibir terapia de reemplazo hormonal (TRH) para tratar los sntomas de la menopausia. Siga estas indicaciones en su casa: Consumo de alcohol No beba alcohol si: Su mdico le indica no hacerlo. Est embarazada, puede estar embarazada o est tratando de [place]. Si bebe alcohol: Limite la cantidad que bebe a lo siguiente: De 0 a 1 bebida por da. Sepa cunta cantidad de alcohol hay en las bebidas que toma. En los [place], una medida equivale a una botella de cerveza de 12 oz (355 ml), un vaso de vino de 5 oz (148 ml) o un vaso de una bebida alcohlica de alta  graduacin de 1 oz (44 ml). Estilo de vida No consuma ningn producto que contenga nicotina o tabaco. Estos productos incluyen cigarrillos, tabaco para Higher education careers adviser y aparatos de vapeo, como los Psychologist, sport and exercise. Si necesita ayuda para dejar de consumir estos productos, consulte al mdico. No consuma drogas. No comparta agujas. Solicite ayuda a su mdico si necesita apoyo o informacin para abandonar las drogas. Indicaciones generales Realcese los estudios de rutina de la salud, dentales y de Public librarian. [place]. Infrmele a su mdico si: Se siente deprimida con frecuencia. Alguna vez ha sido vctima de [place] o no se siente seguro en su casa. Resumen Adoptar un estilo de vida saludable y recibir atencin preventiva son importantes para promover la salud y Musician. Siga las instrucciones del mdico acerca de una dieta saludable, el ejercicio y la realizacin de pruebas o exmenes para Engineer, building services. Siga las instrucciones del mdico con respecto al control del colesterol y la presin arterial. Esta informacin no tiene Marine scientist el consejo del mdico. Asegrese de hacerle al mdico cualquier pregunta que tenga. Document Revised: 02/05/2021 Document Reviewed: 02/05/2021 Elsevier Patient Education  [place], MD [facility]

## 2022-11-10 ENCOUNTER — Ambulatory Visit: Payer: 59 | Admitting: Podiatry

## 2022-12-29 ENCOUNTER — Ambulatory Visit: Payer: 59 | Admitting: Podiatry

## 2022-12-29 DIAGNOSIS — B351 Tinea unguium: Secondary | ICD-10-CM

## 2022-12-29 DIAGNOSIS — Z79899 Other long term (current) drug therapy: Secondary | ICD-10-CM | POA: Diagnosis not present

## 2022-12-29 NOTE — Progress Notes (Unsigned)
  Subjective:  Patient ID: Mackenzie Floyd, female    DOB: September 22, 1970,  MRN: [id number]  Chief Complaint  Patient presents with   Nail Problem    52 y.o. female presents with the above complaint.  Patient presents for follow-up of onychomycosis dystrophic nail to the left hallux.  She states doing a lot better she does not notice it much fungus or if any at all.  She has completed 6 months of Lamisil   Review of Systems: Negative except as noted in the HPI. Denies N/V/F/Ch.  No past medical history on file.  Current Outpatient Medications:    Magnesium Hydroxide (MAGNESIA PO), Take 1 tablet by mouth daily., Disp: , Rfl:    terbinafine (LAMISIL) 250 MG tablet, Take 1 tablet (250 mg total) by mouth daily., Disp: 60 tablet, Rfl: 0   terbinafine (LAMISIL) 250 MG tablet, Take 1 tablet (250 mg total) by mouth daily., Disp: 90 tablet, Rfl: 0   VITAMIN D PO, Take 2,000 mg by mouth daily., Disp: , Rfl:    Wheat Dextrin (BENEFIBER PO), Take 1 tablet by mouth daily at 6 (six) AM. weekdays, Disp: , Rfl:   Social History   Tobacco Use  Smoking Status Never  Smokeless Tobacco Never    No Known Allergies Objective:  There were no vitals filed for this visit. There is no height or weight on file to calculate BMI. Constitutional Well developed. Well nourished.  Vascular Dorsalis pedis pulses palpable bilaterally. Posterior tibial pulses palpable bilaterally. Capillary refill normal to all digits.  No cyanosis or clubbing noted. Pedal hair growth normal.  Neurologic Normal speech. Oriented to person, place, and time. Epicritic sensation to light touch grossly present bilaterally.  Dermatologic No further fungal growth noted to the left hallux.  Rest of the nails within normal limits. Skin within normal limits  Orthopedic: Normal joint ROM without pain or crepitus bilaterally. No visible deformities. No bony tenderness.   Radiographs: None Assessment:   1. Onychomycosis of left great toe    2. Nail fungus   3. Long-term use of high-risk medication     Plan:  Patient was evaluated and treated and all questions answered.  Onychomycosis left hallux~second round  -Clinically looking much better has healed.  Patient has noticed significant reduction and fungal growth.  At this time if any foot and ankle issues on future she will come back and see me.  No follow-ups on file.

## 2023-06-28 ENCOUNTER — Ambulatory Visit: Payer: 59 | Admitting: Emergency Medicine

## 2023-06-28 ENCOUNTER — Encounter: Payer: Self-pay | Admitting: Emergency Medicine

## 2023-06-28 VITALS — BP 110/78 | HR 63 | Temp 98.7°F | Ht 65.0 in | Wt 144.2 lb

## 2023-06-28 DIAGNOSIS — R6889 Other general symptoms and signs: Secondary | ICD-10-CM | POA: Diagnosis not present

## 2023-06-28 DIAGNOSIS — J22 Unspecified acute lower respiratory infection: Secondary | ICD-10-CM | POA: Insufficient documentation

## 2023-06-28 DIAGNOSIS — R062 Wheezing: Secondary | ICD-10-CM

## 2023-06-28 MED ORDER — PREDNISONE 20 MG PO TABS
40.0000 mg | ORAL_TABLET | Freq: Every day | ORAL | 0 refills | Status: AC
Start: 2023-06-28 — End: 2023-07-03

## 2023-06-28 MED ORDER — AZITHROMYCIN 250 MG PO TABS: ORAL_TABLET | ORAL | 0 refills | Status: DC

## 2023-06-28 NOTE — Progress Notes (Signed)
Mackenzie Floyd 52 y.o.   Chief Complaint  Patient presents with   Cough    Congestion, mucus in her lungs started last week, headache     HISTORY OF PRESENT ILLNESS: Acute problem visit today. This is a 52 y.o. female complaining of flulike symptoms that started last week Still complaining of productive cough, chest congestion and headache No other associated symptoms No other complaints or medical concerns today.  Cough Associated symptoms include headaches. Pertinent negatives include no chest pain, chills or fever.     Prior to Admission medications   Medication Sig Start Date End Date Taking? Authorizing Provider  Wheat Dextrin (BENEFIBER PO) Take 1 tablet by mouth daily at 6 (six) AM. weekdays   Yes [provider]  Magnesium Hydroxide (MAGNESIA PO) Take 1 tablet by mouth daily. Patient not taking: Reported on 06/28/2023    [provider]  terbinafine (LAMISIL) 250 MG tablet Take 1 tablet (250 mg total) by mouth daily. Patient not taking: Reported on 06/28/2023 03/05/22   Candelaria Stagers, DPM  terbinafine (LAMISIL) 250 MG tablet Take 1 tablet (250 mg total) by mouth daily. Patient not taking: Reported on 06/28/2023 07/08/22   Candelaria Stagers, DPM  VITAMIN D PO Take 2,000 mg by mouth daily. Patient not taking: Reported on 06/28/2023    [provider]    No Known Allergies  There are no problems to display for this patient.   No past medical history on file.  Past Surgical History:  Procedure Laterality Date   CESAREAN SECTION N/A 1997 and 2008   Phreesia 03/04/2020   TONSILLECTOMY  1985    Social History   Socioeconomic History   Marital status: Married    Spouse name: Not on file   Number of children: 2   Years of education: Not on file   Highest education level: Not on file  Occupational History   Not on file  Tobacco Use   Smoking status: Never   Smokeless tobacco: Never  Vaping Use   Vaping status: Never Used  Substance  and Sexual Activity   Alcohol use: Not Currently    Alcohol/week: 0.0 - 2.0 standard drinks of alcohol    Comment: socially beer and wine   Drug use: Never   Sexual activity: Not on file  Other Topics Concern   Not on file  Social History Narrative   Not on file   Social Determinants of Health   Financial Resource Strain: Not on file  Food Insecurity: Not on file  Transportation Needs: Not on file  Physical Activity: Not on file  Stress: Not on file  Social Connections: Unknown (01/20/2022)   Received from [facility]   Social Network    Social Network: Not on file  Intimate Partner Violence: Unknown (12/18/2021)   Received from [facility], [facility]   HITS    Physically Hurt: Not on file    Insult or Talk Down To: Not on file    Threaten Physical Harm: Not on file    Scream or Curse: Not on file    Family History  Problem Relation Age of Onset   Colon polyps Father 11   Stroke Father    Hypertension Father    Colon cancer Neg Hx    Esophageal cancer Neg Hx    Prostate cancer Neg Hx    Rectal cancer Neg Hx    Stomach cancer Neg Hx      Review of Systems  Constitutional: Negative.  Negative for chills and fever.  HENT:  Positive for congestion.   Respiratory:  Positive for cough.   Cardiovascular: Negative.  Negative for chest pain and palpitations.  Gastrointestinal:  Negative for abdominal pain, diarrhea, nausea and vomiting.  Neurological:  Positive for headaches.  All other systems reviewed and are negative.   Today's Vitals   06/28/23 1304  BP: 110/78  Pulse: 63  Temp: 98.7 F (37.1 C)  TempSrc: Oral  SpO2: 97%  Weight: 144 lb 4 oz (65.4 kg)  Height: 5\' 5"  (1.651 m)   Body mass index is 24 kg/m.   Physical Exam Vitals reviewed.  Constitutional:      Appearance: Normal appearance.  HENT:     Head: Normocephalic.     Right Ear: Tympanic membrane, ear canal and external ear normal.     Left Ear: Tympanic membrane, ear  canal and external ear normal.     Mouth/Throat:     Mouth: Mucous membranes are moist.     Pharynx: Oropharynx is clear.  Eyes:     Extraocular Movements: Extraocular movements intact.     Conjunctiva/sclera: Conjunctivae normal.     Pupils: Pupils are equal, round, and reactive to light.  Cardiovascular:     Rate and Rhythm: Normal rate and regular rhythm.     Pulses: Normal pulses.     Heart sounds: Normal heart sounds.  Pulmonary:     Effort: Pulmonary effort is normal.     Breath sounds: Wheezing present.  Musculoskeletal:     Cervical back: No tenderness.  Lymphadenopathy:     Cervical: No cervical adenopathy.  Skin:    General: Skin is warm and dry.     Capillary Refill: Capillary refill takes less than 2 seconds.  Neurological:     General: No focal deficit present.     Mental Status: She is alert and oriented to person, place, and time.  Psychiatric:        Mood and Affect: Mood normal.        Behavior: Behavior normal.      ASSESSMENT & PLAN: A total of 33 minutes was spent with the patient and counseling/coordination of care regarding preparing for this visit, review of most recent office visit notes, review of chronic medical conditions under management, review of all medications, diagnosis of lower respiratory infection and need for antibiotics, management of flulike symptoms, prognosis, ED precautions, need to stay well-hydrated, documentation and need for follow-up if no better or worse during the next several days.  Problem List Items Addressed This Visit       Respiratory   Lower respiratory infection - Primary    Clinically stable.  No signs of pneumonia. Progressively getting worse over the last week May have secondary bacterial infection Recommend daily azithromycin for 5 days ED precautions given Advised to rest and stay well-hydrated To contact the office if no better or worse during the next several days      Relevant Medications   azithromycin  (ZITHROMAX) 250 MG tablet     Other   Flu-like symptoms    Advised to rest and stay well-hydrated Advised to take over-the-counter Mucinex DM every 6-8 hours as needed Continue Tylenol and or Advil for headaches and bodyaches      Relevant Medications   predniSONE (DELTASONE) 20 MG tablet   Other Visit Diagnoses     Wheezing       Relevant Medications   predniSONE (DELTASONE) 20 MG tablet  Patient Instructions  Bronquitis aguda en los adultos Acute Bronchitis, Adult  La bronquitis aguda es la inflamacin repentina de las vas areas (bronquios) de los pulmones. Esta afeccin puede dificultar la respiracin. En los adultos, la bronquitis aguda generalmente desaparece en 2 semanas. La tos provocada por la bronquitis puede durar hasta 3 semanas. El hbito de fumar, las Environmental consultant y el asma pueden empeorar esta afeccin. Cules son las causas? Los microbios que causan el resfro y la gripe (virus). La causa ms frecuente de esta afeccin es el virus que provoca el resfro comn. Bacterias. Sustancias que molestan (irritan) los pulmones, lo que incluye: Humo de cigarrillos y otros productos de tabaco. Polvo y polen. Vapores de productos qumicos, gases o combustible quemado. Contaminacin del aire interior o exterior. Qu incrementa el riesgo? El sistema de defensa del cuerpo debilitado. Este tambin se denomina sistema inmunitario. Cualquier afeccin que afecte a los pulmones y la respiracin, como el asma. Cules son los signos o sntomas? Tos. Despedir Neomia Dear mucosidad transparente, amarilla o verde al toser. Emitir sonidos de silbidos agudos al respirar, ms a menudo al exhalar (sibilancias). Secrecin o congestin nasal. Exceso de mucosidad en los pulmones (congestin torcica). Falta de aire. Dolores [employer] cuerpo. Dolor de Advertising copywriter. Cmo se trata? La bronquitis aguda puede desaparecer con [employer], sin tratamiento. Su mdico puede recomendarle lo siguiente: Beba  ms lquidos. Esto ayudar a diluir la mucosidad de modo que sea ms fcil expectorarla. Usar un dispositivo que Education administrator en los pulmones (inhalador). Utilizar un humidificador o vaporizador. Estas son mquinas que agregan agua al aire. Esto ayuda con la tos y con la respiracin deficiente. Tomar un medicamento que diluya la mucosidad y ayude a eliminarla de los pulmones. Tomar un medicamento que prevenga o detenga la tos. No es frecuente tomar un antibitico para esta afeccin. Siga estas indicaciones en su casa:  Use los medicamentos de venta libre y los recetados solamente como se lo haya indicado el mdico. Use un Armed forces operational officer, un humidificador o un vaporizador tal como se lo haya indicado el mdico. [employer] cucharaditas (10 ml) de miel a la hora de [place]. Esto ayuda a disminuir la tos por la noche. Beba suficiente lquido para Radio producer pis (la orina) de color amarillo plido. No fume ni consuma ningn producto que contenga nicotina o tabaco. Si necesita ayuda para dejar de fumar, consulte al mdico. Descanse lo suficiente. Regrese a sus actividades normales cuando el [employer] diga que es [place]. Concurra a todas las visitas de seguimiento. Cmo se previene?  Lvese las manos frecuentemente con agua y jabn durante al menos 20 segundos. Use un desinfectante para manos si no dispone de [place] y [place]. Evite el contacto con personas que tienen sntomas de resfro. Trate de no llevarse las manos a la boca, la nariz o los ojos. Evite inhalar humo o vapores qumicos. Recuerde aplicarse la vacuna contra la gripe todos los [place]. Comunquese con un mdico si: Los sntomas no mejoran en el trmino de 2 semanas. Tiene dificultad para expulsar la mucosidad al toser. La tos lo mantiene despierto por la noche. Tiene fiebre. Solicite ayuda de inmediato si: Tose y [employer]. Siente dolor en el pecho. Sufre un episodio muy intenso de falta de aire. Se desmaya o se siente  como si se fuera a desmayar. Tiene un dolor de cabeza muy intenso. La fiebre o los escalofros empeoran. Estos sntomas pueden Customer service manager. Solicite ayuda de inmediato. Comunquese con el servicio de emergencias de su  localidad (911 en los [place]). No espere a ver si los sntomas desaparecen. No conduzca por sus propios medios [employer]. Resumen La bronquitis aguda es la inflamacin repentina de las vas areas (bronquios) de los pulmones. En los adultos, la bronquitis aguda generalmente desaparece en 2 semanas. Beba ms lquidos. Esto ayudar a diluir la mucosidad de modo que sea ms fcil expectorarla. Use los medicamentos de venta libre y los recetados solamente como se lo haya indicado el mdico. Comunquese con un mdico si los sntomas no mejoran despus de 2 semanas de [place]. Esta informacin no tiene Theme park manager el consejo del mdico. Asegrese de hacerle al mdico cualquier pregunta que tenga. Document Revised: 01/19/2021 Document Reviewed: 01/19/2021 Elsevier Patient Education  2024 Elsevier Inc.     Edwina Barth, MD [facility]

## 2023-06-28 NOTE — Patient Instructions (Signed)
Bronquitis aguda en los adultos Acute Bronchitis, Adult  La bronquitis aguda es la inflamacin repentina de las vas areas (bronquios) de los pulmones. Esta afeccin puede dificultar la respiracin. En los adultos, la bronquitis aguda generalmente desaparece en 2 semanas. La tos provocada por la bronquitis puede durar hasta 3 semanas. El hbito de fumar, las Environmental consultant y el asma pueden empeorar esta afeccin. Cules son las causas? Los microbios que causan el resfro y la gripe (virus). La causa ms frecuente de esta afeccin es el virus que provoca el resfro comn. Bacterias. Sustancias que molestan (irritan) los pulmones, lo que incluye: Humo de cigarrillos y otros productos de tabaco. Polvo y polen. Vapores de productos qumicos, gases o combustible quemado. Contaminacin del aire interior o exterior. Qu incrementa el riesgo? El sistema de defensa del cuerpo debilitado. Este tambin se denomina sistema inmunitario. Cualquier afeccin que afecte a los pulmones y la respiracin, como el asma. Cules son los signos o sntomas? Tos. Despedir Neomia Dear mucosidad transparente, amarilla o verde al toser. Emitir sonidos de silbidos agudos al respirar, ms a menudo al exhalar (sibilancias). Secrecin o congestin nasal. Exceso de mucosidad en los pulmones (congestin torcica). Falta de aire. Dolores [employer] cuerpo. Dolor de Advertising copywriter. Cmo se trata? La bronquitis aguda puede desaparecer con [employer], sin tratamiento. Su mdico puede recomendarle lo siguiente: Beba ms lquidos. Esto ayudar a diluir la mucosidad de modo que sea ms fcil expectorarla. Usar un dispositivo que Education administrator en los pulmones (inhalador). Utilizar un humidificador o vaporizador. Estas son mquinas que agregan agua al aire. Esto ayuda con la tos y con la respiracin deficiente. Tomar un medicamento que diluya la mucosidad y ayude a eliminarla de los pulmones. Tomar un medicamento que prevenga o detenga la  tos. No es frecuente tomar un antibitico para esta afeccin. Siga estas indicaciones en su casa:  Use los medicamentos de venta libre y los recetados solamente como se lo haya indicado el mdico. Use un Armed forces operational officer, un humidificador o un vaporizador tal como se lo haya indicado el mdico. [employer] cucharaditas (10 ml) de miel a la hora de [place]. Esto ayuda a disminuir la tos por la noche. Beba suficiente lquido para Radio producer pis (la orina) de color amarillo plido. No fume ni consuma ningn producto que contenga nicotina o tabaco. Si necesita ayuda para dejar de fumar, consulte al mdico. Descanse lo suficiente. Regrese a sus actividades normales cuando el [employer] diga que es [place]. Concurra a todas las visitas de seguimiento. Cmo se previene?  Lvese las manos frecuentemente con agua y jabn durante al menos 20 segundos. Use un desinfectante para manos si no dispone de [place] y [place]. Evite el contacto con personas que tienen sntomas de resfro. Trate de no llevarse las manos a la boca, la nariz o los ojos. Evite inhalar humo o vapores qumicos. Recuerde aplicarse la vacuna contra la gripe todos los [place]. Comunquese con un mdico si: Los sntomas no mejoran en el trmino de 2 semanas. Tiene dificultad para expulsar la mucosidad al toser. La tos lo mantiene despierto por la noche. Tiene fiebre. Solicite ayuda de inmediato si: Tose y [employer]. Siente dolor en el pecho. Sufre un episodio muy intenso de falta de aire. Se desmaya o se siente como si se fuera a desmayar. Tiene un dolor de cabeza muy intenso. La fiebre o los escalofros empeoran. Estos sntomas pueden Customer service manager. Solicite ayuda de inmediato. Comunquese con el servicio de emergencias de su localidad (911 en los  [place]). No espere a ver si los sntomas desaparecen. No conduzca por sus propios medios [employer]. Resumen La bronquitis aguda es la inflamacin repentina de las vas  areas (bronquios) de los pulmones. En los adultos, la bronquitis aguda generalmente desaparece en 2 semanas. Beba ms lquidos. Esto ayudar a diluir la mucosidad de modo que sea ms fcil expectorarla. Use los medicamentos de venta libre y los recetados solamente como se lo haya indicado el mdico. Comunquese con un mdico si los sntomas no mejoran despus de 2 semanas de [place]. Esta informacin no tiene Theme park manager el consejo del mdico. Asegrese de hacerle al mdico cualquier pregunta que tenga. Document Revised: 01/19/2021 Document Reviewed: 01/19/2021 Elsevier Patient Education  2024 [employer].

## 2023-06-28 NOTE — Assessment & Plan Note (Addendum)
Advised to rest and stay well-hydrated Advised to take over-the-counter Mucinex DM every 6-8 hours as needed Continue Tylenol and or Advil for headaches and bodyaches

## 2023-06-28 NOTE — Assessment & Plan Note (Signed)
Clinically stable.  No signs of pneumonia. Progressively getting worse over the last week May have secondary bacterial infection Recommend daily azithromycin for 5 days ED precautions given Advised to rest and stay well-hydrated To contact the office if no better or worse during the next several days

## 2023-07-28 ENCOUNTER — Ambulatory Visit: Payer: 59

## 2023-09-22 ENCOUNTER — Encounter: Payer: 59 | Admitting: Emergency Medicine

## 2023-09-22 DIAGNOSIS — Z124 Encounter for screening for malignant neoplasm of cervix: Secondary | ICD-10-CM | POA: Diagnosis not present

## 2023-09-22 DIAGNOSIS — Z01419 Encounter for gynecological examination (general) (routine) without abnormal findings: Secondary | ICD-10-CM | POA: Diagnosis not present

## 2023-09-22 DIAGNOSIS — Z6824 Body mass index (BMI) 24.0-24.9, adult: Secondary | ICD-10-CM | POA: Diagnosis not present

## 2023-09-22 DIAGNOSIS — Z1151 Encounter for screening for human papillomavirus (HPV): Secondary | ICD-10-CM | POA: Diagnosis not present

## 2023-09-22 DIAGNOSIS — Z1231 Encounter for screening mammogram for malignant neoplasm of breast: Secondary | ICD-10-CM | POA: Diagnosis not present

## 2023-10-01 DIAGNOSIS — E860 Dehydration: Secondary | ICD-10-CM | POA: Diagnosis not present

## 2023-10-01 DIAGNOSIS — R1031 Right lower quadrant pain: Secondary | ICD-10-CM | POA: Diagnosis not present

## 2023-10-01 DIAGNOSIS — K6389 Other specified diseases of intestine: Secondary | ICD-10-CM | POA: Diagnosis not present

## 2023-10-01 DIAGNOSIS — K769 Liver disease, unspecified: Secondary | ICD-10-CM | POA: Diagnosis not present

## 2023-10-01 DIAGNOSIS — K565 Intestinal adhesions [bands], unspecified as to partial versus complete obstruction: Secondary | ICD-10-CM | POA: Diagnosis not present

## 2023-10-01 DIAGNOSIS — R300 Dysuria: Secondary | ICD-10-CM | POA: Diagnosis not present

## 2023-10-01 DIAGNOSIS — R1013 Epigastric pain: Secondary | ICD-10-CM | POA: Diagnosis not present

## 2023-10-01 DIAGNOSIS — R000 Tachycardia, unspecified: Secondary | ICD-10-CM | POA: Diagnosis not present

## 2023-10-01 DIAGNOSIS — K529 Noninfective gastroenteritis and colitis, unspecified: Secondary | ICD-10-CM | POA: Diagnosis not present

## 2023-10-01 DIAGNOSIS — K56600 Partial intestinal obstruction, unspecified as to cause: Secondary | ICD-10-CM | POA: Diagnosis not present

## 2023-10-01 DIAGNOSIS — R112 Nausea with vomiting, unspecified: Secondary | ICD-10-CM | POA: Diagnosis not present

## 2023-10-01 DIAGNOSIS — R109 Unspecified abdominal pain: Secondary | ICD-10-CM | POA: Diagnosis not present

## 2023-10-02 DIAGNOSIS — K529 Noninfective gastroenteritis and colitis, unspecified: Secondary | ICD-10-CM | POA: Diagnosis not present

## 2023-10-02 DIAGNOSIS — R933 Abnormal findings on diagnostic imaging of other parts of digestive tract: Secondary | ICD-10-CM | POA: Diagnosis not present

## 2023-10-02 DIAGNOSIS — R1031 Right lower quadrant pain: Secondary | ICD-10-CM | POA: Diagnosis not present

## 2023-10-02 DIAGNOSIS — R112 Nausea with vomiting, unspecified: Secondary | ICD-10-CM | POA: Diagnosis not present

## 2023-10-02 DIAGNOSIS — K6389 Other specified diseases of intestine: Secondary | ICD-10-CM | POA: Diagnosis not present

## 2023-10-02 DIAGNOSIS — R1084 Generalized abdominal pain: Secondary | ICD-10-CM | POA: Diagnosis not present

## 2023-10-06 ENCOUNTER — Ambulatory Visit (INDEPENDENT_AMBULATORY_CARE_PROVIDER_SITE_OTHER): Payer: 59 | Admitting: Emergency Medicine

## 2023-10-06 ENCOUNTER — Encounter: Payer: Self-pay | Admitting: Emergency Medicine

## 2023-10-06 VITALS — BP 112/78 | HR 72 | Temp 98.5°F | Ht 65.0 in | Wt 145.0 lb

## 2023-10-06 DIAGNOSIS — R1030 Lower abdominal pain, unspecified: Secondary | ICD-10-CM | POA: Insufficient documentation

## 2023-10-06 DIAGNOSIS — Z23 Encounter for immunization: Secondary | ICD-10-CM

## 2023-10-06 DIAGNOSIS — R935 Abnormal findings on diagnostic imaging of other abdominal regions, including retroperitoneum: Secondary | ICD-10-CM | POA: Diagnosis not present

## 2023-10-06 DIAGNOSIS — Z09 Encounter for follow-up examination after completed treatment for conditions other than malignant neoplasm: Secondary | ICD-10-CM

## 2023-10-06 NOTE — Assessment & Plan Note (Signed)
Symptoms resolved.  Asymptomatic today Able to eat and drink normally. Abnormal CT scan of abdomen report reviewed with patient Patient had colonoscopy in 2023 showing polyps but no findings of Crohn's disease. Recommend follow-up with GI doctor.  Referral placed today. ED precautions given Diet and nutrition discussed Advised to contact the office if no better or worse during the next several days.

## 2023-10-06 NOTE — Progress Notes (Signed)
Mackenzie Floyd 53 y.o.   Chief Complaint  Patient presents with   Annual Exam    Patient states going to the ED last week for abdominal pain. She had a CT done and they done here her intestines were inflamed and was advise to see GI she needs referral     HISTORY OF PRESENT ILLNESS: This is a 53 y.o. female here for follow-up of emergency department visit on 10/01/2023 when she presented with abdominal pain Doing well today.  No more symptoms.  CT scan findings raised possibility of Crohn's disease.  No history of inflammatory bowel disease.  No prior symptoms. Patient had colonoscopy in 2023. CT scan abdomen and pelvis findings as follows: CT ab/pel w/ cx (10/01/23) = 1. Mural thickening and mucosal hyperenhancement in the distal ileum and terminal ileum with multiple segmental regions of luminal narrowing. Findings are concerning for inflammatory bowel disease such as Crohn's disease. 2. There is dilatation of small-bowel loops throughout the distal jejunum and ileum with multiple air-fluid levels, as well as near complete decompression of the colon, concerning for a developing small-bowel obstruction. 3. Additional incidental findings, as above. Assessment and plan by emergency department physician as follows: A/P:  Mackenzie Floyd is a 53 y.o. with enteritis with nausea and vomiting. Her symptoms have now resolved and she is tolerating a regular diet and having bowel function. No surgery is necessary given this. Surgery will sign off, please page with any further questions or concerns.   Electronically signed by: Selena Batten, MD 10/02/2023 2:59 PM  Electronically signed by Selena Batten, MD at 10/02/2023 3:00 PM EST  HPI   Prior to Admission medications   Medication Sig Start Date End Date Taking? Authorizing Provider  azithromycin (ZITHROMAX) 250 MG tablet Sig as indicated 06/28/23   Georgina Quint, MD  Magnesium Hydroxide (MAGNESIA PO) Take 1 tablet by mouth  daily. Patient not taking: Reported on 10/06/2023    [provider]  terbinafine (LAMISIL) 250 MG tablet Take 1 tablet (250 mg total) by mouth daily. Patient not taking: Reported on 06/28/2023 03/05/22   Candelaria Stagers, DPM  terbinafine (LAMISIL) 250 MG tablet Take 1 tablet (250 mg total) by mouth daily. Patient not taking: Reported on 06/28/2023 07/08/22   Candelaria Stagers, DPM  VITAMIN D PO Take 2,000 mg by mouth daily. Patient not taking: Reported on 10/06/2023    [provider]  Wheat Dextrin (BENEFIBER PO) Take 1 tablet by mouth daily at 6 (six) AM. weekdays Patient not taking: Reported on 10/06/2023    [provider]    No Known Allergies  There are no active problems to display for this patient.   No past medical history on file.  Past Surgical History:  Procedure Laterality Date   CESAREAN SECTION N/A 1997 and 2008   Phreesia 03/04/2020   TONSILLECTOMY  1985    Social History   Socioeconomic History   Marital status: Married    Spouse name: Not on file   Number of children: 2   Years of education: Not on file   Highest education level: Bachelor's degree (e.g., BA, AB, BS)  Occupational History   Not on file  Tobacco Use   Smoking status: Never   Smokeless tobacco: Never  Vaping Use   Vaping status: Never Used  Substance and Sexual Activity   Alcohol use: Not Currently    Alcohol/week: 0.0 - 2.0 standard drinks of alcohol    Comment: socially beer and wine  Drug use: Never   Sexual activity: Not on file  Other Topics Concern   Not on file  Social History Narrative   Not on file   Social Drivers of Health   Financial Resource Strain: Low Risk  (10/05/2023)   Overall Financial Resource Strain (CARDIA)    Difficulty of Paying Living Expenses: Not very hard  Food Insecurity: No Food Insecurity (10/05/2023)   Hunger Vital Sign    Worried About Running Out of Food in the Last Year: Never true    Ran Out of Food in the Last Year:  Never true  Transportation Needs: No Transportation Needs (10/05/2023)   PRAPARE - Administrator, Civil Service (Medical): No    Lack of Transportation (Non-Medical): No  Physical Activity: Sufficiently Active (10/05/2023)   Exercise Vital Sign    Days of Exercise per Week: 5 days    Minutes of Exercise per Session: 40 min  Stress: No Stress Concern Present (10/05/2023)   [employer] of Occupational Health - Occupational Stress Questionnaire    Feeling of Stress : Not at all  Social Connections: Moderately Integrated (10/05/2023)   Social Connection and Isolation Panel [NHANES]    Frequency of Communication with Friends and Family: More than three times a week    Frequency of Social Gatherings with Friends and Family: Three times a week    Attends Religious Services: More than 4 times per year    Active Member of Clubs or Organizations: No    Attends Banker Meetings: Not on file    Marital Status: Married  Intimate Partner Violence: Unknown (12/18/2021)   Received from [employer], [facility]   HITS    Physically Hurt: Not on file    Insult or Talk Down To: Not on file    Threaten Physical Harm: Not on file    Scream or Curse: Not on file    Family History  Problem Relation Age of Onset   Colon polyps Father 80   Stroke Father    Hypertension Father    Colon cancer Neg Hx    Esophageal cancer Neg Hx    Prostate cancer Neg Hx    Rectal cancer Neg Hx    Stomach cancer Neg Hx      Review of Systems  Constitutional: Negative.  Negative for chills and fever.  HENT: Negative.  Negative for congestion and sore throat.   Respiratory: Negative.  Negative for cough and shortness of breath.   Cardiovascular: Negative.  Negative for chest pain and palpitations.  Gastrointestinal:  Positive for abdominal pain. Negative for blood in stool, melena, nausea and vomiting.  Genitourinary: Negative.  Negative for dysuria and hematuria.  Skin: Negative.   Negative for rash.  Neurological:  Negative for dizziness and headaches.  All other systems reviewed and are negative.   Vitals:   10/06/23 1000  BP: 112/78  Pulse: 72  Temp: 98.5 F (36.9 C)  SpO2: 98%    Physical Exam Vitals reviewed.  Constitutional:      Appearance: Normal appearance.  HENT:     Head: Normocephalic.  Eyes:     Extraocular Movements: Extraocular movements intact.  Cardiovascular:     Rate and Rhythm: Normal rate.  Pulmonary:     Effort: Pulmonary effort is normal.  Abdominal:     Palpations: Abdomen is soft.     Tenderness: There is no abdominal tenderness.  Skin:    General: Skin is warm and dry.  Capillary Refill: Capillary refill takes less than 2 seconds.  Neurological:     General: No focal deficit present.     Mental Status: She is alert and oriented to person, place, and time.  Psychiatric:        Mood and Affect: Mood normal.        Behavior: Behavior normal.      ASSESSMENT & PLAN: A total of 42 minutes was spent with the patient and counseling/coordination of care regarding preparing for this visit, review of most recent office visit notes, review of most recent emergency department visit notes, review of most recent report of CT scan abdomen and pelvis, need for follow-up with gastrointestinal doctor, review of all medications, review of most recent bloodwork results, review of health maintenance items, education on nutrition, prognosis, documentation, and need for follow up.   Problem List Items Addressed This Visit       Other   Abnormal abdominal CT scan   CT ab/pel w/ cx (10/01/23) = 1. Mural thickening and mucosal hyperenhancement in the distal ileum and terminal ileum with multiple segmental regions of luminal narrowing. Findings are concerning for inflammatory bowel disease such as Crohn's disease. 2. There is dilatation of small-bowel loops throughout the distal jejunum and ileum with multiple air-fluid levels, as well as  near complete decompression of the colon, concerning for a developing small-bowel obstruction. 3. Additional incidental findings, as above.      Relevant Orders   Ambulatory referral to Gastroenterology   Lower abdominal pain - Primary   Symptoms resolved.  Asymptomatic today Able to eat and drink normally. Abnormal CT scan of abdomen report reviewed with patient Patient had colonoscopy in 2023 showing polyps but no findings of Crohn's disease. Recommend follow-up with GI doctor.  Referral placed today. ED precautions given Diet and nutrition discussed Advised to contact the office if no better or worse during the next several days.      Relevant Orders   Ambulatory referral to Gastroenterology   Other Visit Diagnoses       Need for vaccination       Relevant Orders   Flu vaccine trivalent PF, 6mos and older(Flulaval,Afluria,Fluarix,Fluzone)     Hospital discharge follow-up          Patient Instructions  Dolor abdominal en los adultos Abdominal Pain, Adult  El dolor de estmago (abdominal) puede tener muchas causas. En la [employer], el dolor de [place] no es un problema grave y puede controlarse y Scientist, clinical (histocompatibility and immunogenetics). Pero en [employer], puede ser grave. El mdico intentar descubrir la causa del dolor de [place]. Siga estas instrucciones en su casa: Medicamentos [employer] de venta libre y los recetados solamente como se lo haya indicado el mdico. No tome medicamentos que lo ayuden a Advertising copywriter (laxantes), salvo que el mdico se lo indique. Instrucciones generales Est atento al dolor de estmago para Insurance risk surveyor cambio. Informe al mdico si el dolor empeora. Beber suficiente lquido para Radio producer pis (orina) de color amarillo plido. Comunquese con un mdico si: El dolor de [address] cambia o [place]. Tiene clicos muy intensos o mucha distensin en el vientre. Vomita. El dolor empeora con las comidas, despus de comer o con  determinados alimentos. Tiene dificultades para defecar o produce heces lquidas durante ms de 2 o 3 das. No tiene apetito o baja de peso sin proponrselo. Presenta signos de no tener suficientes lquido o agua en el cuerpo (deshidratacin). Pueden incluir: Larose [place], [address]  escasa o falta de orina. Labios agrietados o Building surveyor. Somnolencia o debilidad. Siente dolor al orinar o defecar. El dolor de estmago lo despierta de noche. Observa sangre en la orina. Tiene fiebre. Solicite ayuda de inmediato si: No puede dejar de vomitar. Siente Chief Technology Officer en una sola parte del vientre, por ejemplo, en el lado derecho. Tiene heces con sangre, de color negro o con aspecto alquitranado. Tiene dificultad para respirar. Tiene dolor en el pecho. Estos sntomas pueden Customer service manager. Solicite ayuda de inmediato. Llame al 911. No espere a ver si los sntomas desaparecen. No conduzca por sus propios medios [employer]. Esta informacin no tiene Theme park manager el consejo del mdico. Asegrese de hacerle al mdico cualquier pregunta que tenga. Document Revised: 10/09/2022 Document Reviewed: 10/09/2022 Elsevier Patient Education  2024 Elsevier Inc.     Edwina Barth, MD [facility]

## 2023-10-06 NOTE — Assessment & Plan Note (Signed)
CT ab/pel w/ cx (10/01/23) = 1. Mural thickening and mucosal hyperenhancement in the distal ileum and terminal ileum with multiple segmental regions of luminal narrowing. Findings are concerning for inflammatory bowel disease such as Crohn's disease. 2. There is dilatation of small-bowel loops throughout the distal jejunum and ileum with multiple air-fluid levels, as well as near complete decompression of the colon, concerning for a developing small-bowel obstruction. 3. Additional incidental findings, as above.

## 2023-10-06 NOTE — Patient Instructions (Signed)
Dolor abdominal en los adultos Abdominal Pain, Adult  El dolor de [place] (abdominal) puede tener muchas causas. En la [employer], el dolor de [place] no es un problema grave y puede controlarse y Scientist, clinical (histocompatibility and immunogenetics). Pero en [employer], puede ser grave. El mdico intentar descubrir la causa del dolor de [place]. Siga estas instrucciones en su casa: Medicamentos [employer] de venta libre y los recetados solamente como se lo haya indicado el mdico. No tome medicamentos que lo ayuden a Advertising copywriter (laxantes), salvo que el mdico se lo indique. Instrucciones generales Est atento al dolor de estmago para Insurance risk surveyor cambio. Informe al mdico si el dolor empeora. Beber suficiente lquido para Radio producer pis (orina) de color amarillo plido. Comunquese con un mdico si: El dolor de [address] cambia o [place]. Tiene clicos muy intensos o mucha distensin en el vientre. Vomita. El dolor empeora con las comidas, despus de comer o con determinados alimentos. Tiene dificultades para defecar o produce heces lquidas durante ms de 2 o 3 das. No tiene apetito o baja de peso sin proponrselo. Presenta signos de no tener suficientes lquido o agua en el cuerpo (deshidratacin). Pueden incluir: [place], muy escasa o falta de [place]. Labios agrietados o Building surveyor. Somnolencia o debilidad. Siente dolor al orinar o defecar. El dolor de estmago lo despierta de noche. Observa sangre en la orina. Tiene fiebre. Solicite ayuda de inmediato si: No puede dejar de vomitar. Siente Chief Technology Officer en una sola parte del vientre, por ejemplo, en el lado derecho. Tiene heces con sangre, de color negro o con aspecto alquitranado. Tiene dificultad para respirar. Tiene dolor en el pecho. Estos sntomas pueden Customer service manager. Solicite ayuda de inmediato. Llame al 911. No espere a ver si los sntomas desaparecen. No conduzca por sus propios medios [employer]. Esta  informacin no tiene Theme park manager el consejo del mdico. Asegrese de hacerle al mdico cualquier pregunta que tenga. Document Revised: 10/09/2022 Document Reviewed: 10/09/2022 Elsevier Patient Education  2024 [employer].

## 2023-10-13 NOTE — Progress Notes (Signed)
Chief Complaint: Abnormal CT scan, lower abdominal pain Primary GI Doctor: Dr. Adela Lank  HPI: Patient is a 53 year old female patient with with no significant medical history, who was referred to me by Georgina Quint, * on 10/06/23 for Abnormal CT scan, lower abdominal pain.  Patient presented to [facility] ED on 10/01/23 with c/o abdominal pain, nuasea and vomiting.CT showed findings concerning for IBD. Pt was seen by Dr. Cliffton Asters (EGS) but surgical intervention was not felt indicated. CMP normal except for T Bili 1.4, glucose 150, calcium 11.1. Hgb 16.4. Normal wbc, plts. Follow-up with GI.   Patient was last seen by Dr. Adela Lank on 01/20/22 for colon cancer screening. - One diminutive polyp in the sigmoid colon, removed with a cold snare. Resected and retrieved.  - Tortuous colon.  - The examination was otherwise normal. Path:  Diagnosis Surgical [P], colon, sigmoid, polyp (1) - HYPERPLASTIC POLYP (S).  10/01/23 CT ABD/pelvis w/ contrast IMPRESSION:  1. Mural thickening and mucosal hyperenhancement in the distal ileum  and terminal ileum with multiple segmental regions of luminal  narrowing. Findings are concerning for inflammatory bowel disease  such as Crohn's disease.  2. There is dilatation of small-bowel loops throughout the distal  jejunum and ileum with multiple air-fluid levels, as well as near  complete decompression of the colon, concerning for a developing  small-bowel obstruction.  3. Additional incidental findings, as above.   Interval History    Patient presents for follow-up after recent ED visit. She reports that on 1/18 she went to bed with abdominal cramping and sour stomach after having dinner. She woke up in the middle of the night with nausea and vomiting . The pain increased in severity so she asked her husband to take her to ED.   She currently reports the lower abdominal pain has improved some in the last 24 hours to a mild ache in lower abdomen.  She also  has bloating. She has never had any symptoms consistent with IBD before and she has a pretty recent colonoscopy which was unremarkable. She has never had any abdominal surgeries. She does tell me she has had similar abdominal pain on 2-3 occasions over the course of the past few years, but it has never been severe enough she had to go to ED. She said it would usually resolve on its own. She typically has one bowel movement daily, but does not always feel like she empties out. She uses OTC Calm magnesium prn. No blood in stool. No new medications. No NSAID use. No abdominal surgeries. No family history of IBD or GI issues.   Wt Readings from Last 3 Encounters:  10/14/23 146 lb (66.2 kg)  10/06/23 145 lb (65.8 kg)  06/28/23 144 lb 4 oz (65.4 kg)    History reviewed. No pertinent past medical history.  Past Surgical History:  Procedure Laterality Date   CESAREAN SECTION N/A 1997 and 2008   Phreesia 03/04/2020   TONSILLECTOMY  1985   Current Outpatient Medications  Medication Sig Dispense Refill   Probiotic Product (PROBIOTIC DAILY PO) Take 1 capsule by mouth daily. Instructions are take 1 capsule for 4 days and then increase to 2 capsules     No current facility-administered medications for this visit.    Allergies as of 10/14/2023   (No Known Allergies)   Family History  Problem Relation Age of Onset   Colon polyps Father 70   Stroke Father    Hypertension Father    Colon cancer  Neg Hx    Esophageal cancer Neg Hx    Prostate cancer Neg Hx    Rectal cancer Neg Hx    Stomach cancer Neg Hx     Review of Systems:    Constitutional: No weight loss, fever, chills, weakness or fatigue HEENT: Eyes: No change in vision               Ears, Nose, Throat:  No change in hearing or congestion Skin: No rash or itching Cardiovascular: No chest pain, chest pressure or palpitations   Respiratory: No SOB or cough Gastrointestinal: See HPI and otherwise negative Genitourinary: No dysuria or  change in urinary frequency Neurological: No headache, dizziness or syncope Musculoskeletal: No new muscle or joint pain Hematologic: No bleeding or bruising Psychiatric: No history of depression or anxiety    Physical Exam:  Vital signs: BP 120/74   Pulse 84   Ht 5\' 5"  (1.651 m)   Wt 146 lb (66.2 kg)   LMP 06/14/2023 (Within Days)   SpO2 99%   BMI 24.30 kg/m   Constitutional: Pleasant Caucasian female appears to be in NAD, Well developed, Well nourished, alert and cooperative Neck:  Supple Throat: Oral cavity and pharynx without inflammation, swelling or lesion.  Respiratory: Respirations even and unlabored. Lungs clear to auscultation bilaterally.   No wheezes, crackles, or rhonchi.  Cardiovascular: Normal S1, S2. Regular rate and rhythm. No peripheral edema, cyanosis or pallor.  Gastrointestinal:  Soft, nondistended, lower abdominal tenderness with palaption. No rebound or guarding. Normal bowel sounds. No appreciable masses or hepatomegaly. Rectal:  Not performed.  Msk:  Symmetrical without gross deformities. Without edema, no deformity or joint abnormality.  Neurologic:  Alert and  oriented x4;  grossly normal neurologically.  Skin:   Dry and intact without significant lesions or rashes. Psychiatric: Oriented to person, place and time. Demonstrates good judgement and reason without abnormal affect or behaviors.  RELEVANT LABS AND IMAGING: CBC    Latest Ref Rng & Units 09/16/2022    9:35 AM 08/25/2021   10:26 AM 03/11/2020    2:09 PM  CBC  WBC 4.0 - 10.5 K/uL 4.4  5.0  5.4   Hemoglobin 12.0 - 15.0 g/dL 40.9  81.1  91.4   Hematocrit 36.0 - 46.0 % 40.7  43.6  38.5   Platelets 150.0 - 400.0 K/uL 189.0  139.0  141.0     CMP     Latest Ref Rng & Units 09/16/2022    9:35 AM 07/07/2022    9:14 AM 03/04/2022   11:16 AM  CMP  Glucose 70 - 99 mg/dL 81     BUN 6 - 23 mg/dL 15     Creatinine 7.82 - 1.20 mg/dL 9.56     Sodium 213 - 086 mEq/L 137     Potassium 3.5 - 5.1 mEq/L  4.4     Chloride 96 - 112 mEq/L 102     CO2 19 - 32 mEq/L 29     Calcium 8.4 - 10.5 mg/dL 9.7     Total Protein 6.0 - 8.3 g/dL 7.7  7.3  7.2   Total Bilirubin 0.2 - 1.2 mg/dL 1.2  1.0  1.1   Alkaline Phos 39 - 117 U/L 85  94  82   AST 0 - 37 U/L 23  21  20    ALT 0 - 35 U/L 14  12  12     10/01/23 labs show: BUN 22/creat 0.97, Total Bilirubin 1.4, Hgb 16.4, PLT 168  Assessment: Encounter Diagnoses  Name Primary?   Abnormal CT scan, small bowel Yes   Lower abdominal pain       53 year old female patient with negative medical history who presents after recent ED visit where CT scan showed enteritis with no suspected viral or bacterial illness. No new medication use. No NSAID use. No history of IBD. 5/23 Colonoscopy was introduced through the anus and advanced to the the cecum, identified by appendiceal orifice and ileocecal valve, which were all normal. Her symptoms are slowly improving. We discussed low residue diet and I will discuss her case with Dr. Adela Lank. Will defer CT or MR enterography to evaluate small bowel to Dr. Adela Lank.   Plan: -Low residue diet until symptoms have completely resolved.  -if symptoms reoccur or persist Makih Stefanko consider a CT or MR enterography to evaluate small bowel lumen.  -Colonoscopy, recall 01/2032  Thank you for the courtesy of this consult. Please call me with any questions or concerns.   Merritt Kibby, FNP-C [facility] 10/14/2023, 2:28 PM  Cc: Georgina Quint, [place]

## 2023-10-14 ENCOUNTER — Ambulatory Visit: Payer: 59 | Admitting: Gastroenterology

## 2023-10-14 ENCOUNTER — Encounter: Payer: Self-pay | Admitting: Gastroenterology

## 2023-10-14 VITALS — BP 120/74 | HR 84 | Ht 65.0 in | Wt 146.0 lb

## 2023-10-14 DIAGNOSIS — R1030 Lower abdominal pain, unspecified: Secondary | ICD-10-CM | POA: Diagnosis not present

## 2023-10-14 DIAGNOSIS — R933 Abnormal findings on diagnostic imaging of other parts of digestive tract: Secondary | ICD-10-CM | POA: Diagnosis not present

## 2023-10-14 NOTE — Patient Instructions (Addendum)
Follow up as needed.  _______________________________________________________  If your blood pressure at your visit was 140/90 or greater, please contact your primary care physician to follow up on this.  _______________________________________________________  If you are age 53 or older, your body mass index should be between 23-30. Your Body mass index is 24.3 kg/m. If this is out of the aforementioned range listed, please consider follow up with your Primary Care Provider.  If you are age 3 or younger, your body mass index should be between 19-25. Your Body mass index is 24.3 kg/m. If this is out of the aformentioned range listed, please consider follow up with your Primary Care Provider.   ________________________________________________________  The [facility] providers would like to encourage you to use [facility] to communicate with providers for non-urgent requests or questions.  Due to long hold times on the telephone, sending your provider a message by [facility] may be a faster and more efficient way to get a response.  Please allow 48 business hours for a response.  Please remember that this is for non-urgent requests.  _______________________________________________________ It was a pleasure to see you today!  Thank you for trusting me with your gastrointestinal care!

## 2023-10-15 NOTE — Progress Notes (Signed)
Agree with assessment and plan as outlined.  Prior colonoscopy was for screening purposes and ileum was not intubated.  Further she had a technically challenging cecal intubation, ileal intubation may be difficult.  That being said, with these symptoms and imaging findings, would reassess in some way to rule out IBD, and certainly if symptoms persist. Would either do colonoscopy with ileal intubation or MR enterography study if she is willing.

## 2023-10-18 ENCOUNTER — Other Ambulatory Visit: Payer: Self-pay

## 2023-10-18 DIAGNOSIS — R1030 Lower abdominal pain, unspecified: Secondary | ICD-10-CM

## 2023-10-18 DIAGNOSIS — R933 Abnormal findings on diagnostic imaging of other parts of digestive tract: Secondary | ICD-10-CM

## 2023-10-19 ENCOUNTER — Telehealth: Payer: Self-pay | Admitting: Gastroenterology

## 2023-10-19 ENCOUNTER — Other Ambulatory Visit: Payer: Self-pay | Admitting: Gastroenterology

## 2023-10-19 DIAGNOSIS — R933 Abnormal findings on diagnostic imaging of other parts of digestive tract: Secondary | ICD-10-CM

## 2023-10-19 DIAGNOSIS — R1030 Lower abdominal pain, unspecified: Secondary | ICD-10-CM

## 2023-10-19 NOTE — Telephone Encounter (Signed)
 Patient insurance rep called and stated that they need the right referral sent over to [facility] for an MRI with and without contrast. Insurance rep stated that a good fax number for [facility] is [phone]. Insurance rep is requesting that someone reach out to them at [phone]. Please advise.

## 2023-10-20 ENCOUNTER — Ambulatory Visit (HOSPITAL_COMMUNITY): Admission: RE | Admit: 2023-10-20 | Payer: 59 | Source: Ambulatory Visit

## 2023-10-21 NOTE — Telephone Encounter (Signed)
 Inbound call from patient, states [facility] has not received the order, would like it resent.

## 2023-11-08 DIAGNOSIS — R1030 Lower abdominal pain, unspecified: Secondary | ICD-10-CM | POA: Diagnosis not present

## 2023-11-08 DIAGNOSIS — K5000 Crohn's disease of small intestine without complications: Secondary | ICD-10-CM | POA: Diagnosis not present

## 2023-11-08 DIAGNOSIS — R933 Abnormal findings on diagnostic imaging of other parts of digestive tract: Secondary | ICD-10-CM | POA: Diagnosis not present

## 2023-11-08 DIAGNOSIS — K56609 Unspecified intestinal obstruction, unspecified as to partial versus complete obstruction: Secondary | ICD-10-CM | POA: Diagnosis not present

## 2023-11-14 ENCOUNTER — Telehealth: Payer: Self-pay | Admitting: Gastroenterology

## 2023-11-14 NOTE — Telephone Encounter (Signed)
 Called and spoke with patient. Pt has been scheduled for a f/u with Dr. Adela Lank on 11/18/23 at 8:10 am, pt will arrive by 8 am.

## 2023-11-14 NOTE — Telephone Encounter (Signed)
 Thanks Deanna Based on CT scan certainly possible she may have small bowel Crohn's disease. If she is willing to do a colonoscopy I think that may be best to try to confirm the diagnosis and get tissue biopsies.  Please let her know there is a chance it may not be possible to get in that part of her bowel given the difficulty with her last colonoscopy but most of the time we are successful in doing that.  If she is willing can you refer to the nursing staff to schedule colonoscopy at the [facility]. Otherwise in the interim would like to get a fecal calprotectin for baseline if he has to go to the lab as well.  If she is not willing to do the colonoscopy please let me know, I would need to see her back in the office to discuss options further. Thanks

## 2023-11-14 NOTE — Telephone Encounter (Signed)
 Patient called and stated that he had received her result form her MRI and was wondering if she can get a call back to go over them. Patient is requesting a call back. Please advise.

## 2023-11-14 NOTE — Telephone Encounter (Signed)
 Okay that sounds fine, thanks for coordinating, I appreciate it

## 2023-11-14 NOTE — Telephone Encounter (Signed)
 Deanna- Please review MR entero (care everywhere) from 11/08/23 and advise. Thanks

## 2023-11-18 ENCOUNTER — Other Ambulatory Visit (INDEPENDENT_AMBULATORY_CARE_PROVIDER_SITE_OTHER)

## 2023-11-18 ENCOUNTER — Encounter: Payer: Self-pay | Admitting: Gastroenterology

## 2023-11-18 ENCOUNTER — Ambulatory Visit: Admitting: Gastroenterology

## 2023-11-18 VITALS — BP 112/70 | HR 84 | Ht 65.0 in | Wt 145.0 lb

## 2023-11-18 DIAGNOSIS — R1031 Right lower quadrant pain: Secondary | ICD-10-CM

## 2023-11-18 DIAGNOSIS — R933 Abnormal findings on diagnostic imaging of other parts of digestive tract: Secondary | ICD-10-CM

## 2023-11-18 LAB — CBC WITH DIFFERENTIAL/PLATELET
Basophils Absolute: 0 10*3/uL (ref 0.0–0.1)
Basophils Relative: 0.9 % (ref 0.0–3.0)
Eosinophils Absolute: 0.2 10*3/uL (ref 0.0–0.7)
Eosinophils Relative: 5.9 % — ABNORMAL HIGH (ref 0.0–5.0)
HCT: 44 % (ref 36.0–46.0)
Hemoglobin: 14.5 g/dL (ref 12.0–15.0)
Lymphocytes Relative: 23.7 % (ref 12.0–46.0)
Lymphs Abs: 0.9 10*3/uL (ref 0.7–4.0)
MCHC: 33 g/dL (ref 30.0–36.0)
MCV: 84.4 fl (ref 78.0–100.0)
Monocytes Absolute: 0.5 10*3/uL (ref 0.1–1.0)
Monocytes Relative: 13.2 % — ABNORMAL HIGH (ref 3.0–12.0)
Neutro Abs: 2 10*3/uL (ref 1.4–7.7)
Neutrophils Relative %: 56.3 % (ref 43.0–77.0)
Platelets: 136 10*3/uL — ABNORMAL LOW (ref 150.0–400.0)
RBC: 5.21 Mil/uL — ABNORMAL HIGH (ref 3.87–5.11)
RDW: 13.8 % (ref 11.5–15.5)
WBC: 3.6 10*3/uL — ABNORMAL LOW (ref 4.0–10.5)

## 2023-11-18 LAB — IBC + FERRITIN
Ferritin: 21.6 ng/mL (ref 10.0–291.0)
Iron: 43 ug/dL (ref 42–145)
Saturation Ratios: 14 % — ABNORMAL LOW (ref 20.0–50.0)
TIBC: 308 ug/dL (ref 250.0–450.0)
Transferrin: 220 mg/dL (ref 212.0–360.0)

## 2023-11-18 LAB — VITAMIN B12: Vitamin B-12: 688 pg/mL (ref 211–911)

## 2023-11-18 MED ORDER — SUTAB 1479-225-188 MG PO TABS
ORAL_TABLET | ORAL | 0 refills | Status: DC
Start: 2023-11-18 — End: 2024-03-23

## 2023-11-18 MED ORDER — IBGARD 90 MG PO CPCR
ORAL_CAPSULE | ORAL | Status: DC
Start: 2023-11-18 — End: 2024-03-23

## 2023-11-18 NOTE — Patient Instructions (Addendum)
 Your provider has requested that you go to the basement level for lab work before leaving today. Press "B" on the elevator. The lab is located at the first door on the left as you exit the elevator.   Due to recent changes in healthcare laws, you may see the results of your imaging and laboratory studies on MyChart before your provider has had a chance to review them.  We understand that in some cases there may be results that are confusing or concerning to you. Not all laboratory results come back in the same time frame and the provider may be waiting for multiple results in order to interpret others.  Please give [place] 48 hours in order for your provider to thoroughly review all the results before contacting the office for clarification of your results.    We are giving you a handout regarding a low residual diet.  We have given you samples of the following medication to take: IBgard take as directed.   You have been scheduled for a colonoscopy. Please follow written instructions given to you at your visit today.   If you use inhalers (even only as needed), please bring them with you on the day of your procedure.  DO NOT TAKE 7 DAYS PRIOR TO TEST- Trulicity (dulaglutide) Ozempic, Wegovy (semaglutide) Mounjaro (tirzepatide) Bydureon Bcise (exanatide extended release)  DO NOT TAKE 1 DAY PRIOR TO YOUR TEST Rybelsus (semaglutide) Adlyxin (lixisenatide) Victoza (liraglutide) Byetta (exanatide) ___________________________________________________________________________  Bonita Quin will receive your bowel preparation through [facility], which ensures the lowest copay and home delivery, with outreach via text or call from an [phone] number. Please respond promptly to avoid rescheduling of your procedure. If you are interested in alternative options or have any questions regarding your prep, please contact them at  [phone] ____________________________________________________________________________  Your Provider Has Sent Your Bowel Prep Regimen To [facility]   [facility] will contact you to verify your information and collect your copay, if applicable. Enjoy the comfort of your home while your prescription is mailed to you, FREE of any shipping charges.   [facility] accepts all major insurance benefits and applies discounts & coupons.  Have additional questions?   Chat: [email or web address] Call: [phone] Email: [email or web address] NCPDP: [id number]  How will [facility] contact you?  With a Welcome phone call,  a Welcome text and a checkout link in text form.  Texts you receive from [phone] Are NOT Spam.  *To set up delivery, you must complete the checkout process via link or speak to one of the patient care representatives. If [facility] is unable to reach you, your prescription may be delayed.  To avoid long hold times on the phone, you may also utilize the secure chat feature on the [facility] website to request that they call you back for transaction completion or to expedite your concerns.    Thank you for entrusting me with your care and for choosing [facility], Dr. Ileene Patrick    _______________________________________________________  If your blood pressure at your visit was 140/90 or greater, please contact your primary care physician to follow up on this.  _______________________________________________________  If you are age 63 or older, your body mass index should be between 23-30. Your Body mass index is 24.13 kg/m. If this is out of the aforementioned range listed, please consider follow up with your Primary Care Provider.  If you are age 16 or younger, your body mass index should be between 19-25. Your Body mass index is 24.13 kg/m. If this is out of  the aformentioned range listed, please consider follow up with your Primary Care  Provider.   ________________________________________________________  The [facility] providers would like to encourage you to use [facility] to communicate with providers for non-urgent requests or questions.  Due to long hold times on the telephone, sending your provider a message by [facility] may be a faster and more efficient way to get a response.  Please allow 48 business hours for a response.  Please remember that this is for non-urgent requests.  _______________________________________________________

## 2023-11-18 NOTE — Progress Notes (Signed)
 HPI :  53 year old female here for a follow-up visit for abdominal pain, abnormal CT scan. I know her from prior colonoscopy in May 2023 which did not show any concerning findings but she did have a very tortuous colon, difficult cecal intubation.  Recall that she was seen in the [facility] ED in January for abdominal pain, nausea and vomiting.  She had a CT scan showing ileitis, concerning for possible Crohn's disease.  Normal white blood cell count, normal hemoglobin, had follow-up with [place].  We proceeded with a follow-up MR enterography which was done on February 25, showing short segment wall thickening in mucosal hyperenhancement with a 7 cm segment of ileum from the IC valve.  Changes very consistent with Crohn's disease.  No obvious active bowel obstruction.  No fistula or abscess.  She states the contrast from the MR E gave her some discomfort but since that time she has been doing okay.  She does have some occasional discomfort in her right lower quadrant that has been ongoing since January although much milder.  She describes it as a burning sensation that can come and go, eating certain foods can make it worse.  She does have some relief with a bowel movement.  She usually moves her bowels every day but no diarrhea.  No blood in her stool.  Back in 2023 she states she had significant abdominal pain in that area and thought she had appendicitis but it was gone within a day or so.  She has lost about 10 pounds since this started as she is eating less because of the symptoms.  She denies any NSAID use, not taking any medication otherwise.  No tobacco use.  Not using any over-the-counter supplements.  No family history of IBD noted.  Colonoscopy 01/20/22 for colon cancer screening. - One diminutive polyp in the sigmoid colon, removed with a cold snare. Resected and retrieved.  - Tortuous colon.  - The examination was otherwise normal. Path:  Diagnosis Surgical [P], colon, sigmoid, polyp (1) -  HYPERPLASTIC POLYP (S).   10/01/23 CT ABD/pelvis w/ contrast IMPRESSION:  1. Mural thickening and mucosal hyperenhancement in the distal ileum  and terminal ileum with multiple segmental regions of luminal  narrowing. Findings are concerning for inflammatory bowel disease  such as Crohn's disease.  2. There is dilatation of small-bowel loops throughout the distal  jejunum and ileum with multiple air-fluid levels, as well as near  complete decompression of the colon, concerning for a developing  small-bowel obstruction.  3. Additional incidental findings, as above.     MRE 11/08/23: 1. Short segment wall thickening and mucosal hyperenhancement of the  terminal ileum, affecting a segment approximately 7 cm in length  from the ileocecal valve. 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  2. Previously seen distal small bowel obstruction is resolved as are  additional thickened and hyperenhancing loops of distal ileum seen  in the right lower quadrant on prior examination.  3. No evidence of overt stricture, fistula, or abscess.  4. Trace free fluid in the pelvis, likely reactive.     History reviewed. No pertinent past medical history. Healthy per patient   Past Surgical History:  Procedure Laterality Date   CESAREAN SECTION N/A 1997 and 2008   Phreesia 03/04/2020   TONSILLECTOMY  1985   Family History  Problem Relation Age of Onset   Colon polyps Father 46   Stroke Father    Hypertension Father    Colon cancer Neg  Hx    Esophageal cancer Neg Hx    Prostate cancer Neg Hx    Rectal cancer Neg Hx    Stomach cancer Neg Hx    Social History   Tobacco Use   Smoking status: Never   Smokeless tobacco: Never  Vaping Use   Vaping status: Never Used  Substance Use Topics   Alcohol use: Not Currently    Alcohol/week: 0.0 - 2.0 standard drinks of alcohol    Comment: socially beer and wine   Drug use: Never   Current Outpatient Medications  Medication Sig  Dispense Refill   Probiotic Product (PROBIOTIC DAILY PO) Take 1 capsule by mouth daily. Instructions are take 1 capsule for 4 days and then increase to 2 capsules     No current facility-administered medications for this visit.   No Known Allergies   Review of Systems: All systems reviewed and negative except where noted in HPI.   Labs in Epic and care everywhere reviewed  Physical Exam: BP 112/70   Pulse 84   Ht 5\' 5"  (1.651 m)   Wt 145 lb (65.8 kg)   LMP 06/14/2023 (Within Days)   BMI 24.13 kg/m  Constitutional: Pleasant,well-developed, female in no acute distress. Abdominal: Soft, nondistended, nontender.  There are no masses palpable. No hepatomegaly. Neurological: Alert and oriented to person place and time. Psychiatric: Normal mood and affect. Behavior is normal.   ASSESSMENT: 53 y.o. female here for assessment of the following  1. Abnormal finding on GI tract imaging   2. RLQ abdominal pain    Discussed Dx for her abdominal pain and CT/MR E findings.  Given persistence over time highly suspicious for Crohn's disease.  Infectious enteritis would be atypical to present like this, other more rare causes of enteritis reviewed such as lymphoma, TB etc.  Ultimately she needs a tissue diagnosis, recommend colonoscopy with ileal intubation with biopsies to try and clarify diagnosis for her.  On review of her last colonoscopy she does have a very tortuous colon with difficult cecal intubation.  I counseled her there is a risk that we may not be able to intubate the ileum however, every effort will be made to do so.  I discussed risks and benefits of colonoscopy and anesthesia and she wants to proceed.  Her symptoms are rather mild at this time and stable.  We discussed she is at risk for worsening with intestinal obstruction and complications.  We discussed if she wanted to be treated with some steroids empirically until the colonoscopy is done, although that could mask findings.   She wants to hold off on steroids for now and await colonoscopy results.  In the interim I will ask her to go to the lab today, repeat CBC, screen for B12 and iron deficiency, screen for TB with QuantiFERON gold especially in case Biologics are needed.  Will otherwise give her some IBgard to use as needed for abdominal pain and spasm.  Recommend she stay on a low residual diet.  If she has any worsening pain, nausea vomiting, obstructive symptoms she to contact us.  She should continue avoid NSAIDs.  PLAN: - schedule colonoscopy at the [facility] - lab today - CBC, B12, TIBC / ferritin, quantiferon gold - low residual diet - IB gard PRN, samples given - discussed trial of empiric steroids, she wants to hold off on that for now - call if worsening symptoms in the interim - no NSAIDs  Harlin Rain, MD [facility]

## 2023-11-22 LAB — QUANTIFERON-TB GOLD PLUS
Mitogen-NIL: >10 [IU]/mL
NIL: 0.12 [IU]/mL
QuantiFERON-TB Gold Plus: POSITIVE — AB
TB1-NIL: 0.46 [IU]/mL
TB2-NIL: 0.42 [IU]/mL

## 2023-11-23 ENCOUNTER — Other Ambulatory Visit

## 2023-11-23 ENCOUNTER — Other Ambulatory Visit: Payer: Self-pay | Admitting: *Deleted

## 2023-11-23 ENCOUNTER — Ambulatory Visit (INDEPENDENT_AMBULATORY_CARE_PROVIDER_SITE_OTHER)
Admission: RE | Admit: 2023-11-23 | Discharge: 2023-11-23 | Disposition: A | Discharge status: Home / Self Care | Source: Ambulatory Visit | Attending: Gastroenterology | Admitting: Gastroenterology

## 2023-11-23 DIAGNOSIS — R7612 Nonspecific reaction to cell mediated immunity measurement of gamma interferon antigen response without active tuberculosis: Secondary | ICD-10-CM | POA: Diagnosis not present

## 2023-11-25 LAB — QUANTIFERON-TB GOLD PLUS
QuantiFERON Mitogen Value: 7.08 [IU]/mL
QuantiFERON Nil Value: 0.04 [IU]/mL
QuantiFERON TB1 Ag Value: 1.98 [IU]/mL
QuantiFERON TB2 Ag Value: 2.8 [IU]/mL
QuantiFERON-TB Gold Plus: POSITIVE — AB

## 2023-11-28 ENCOUNTER — Other Ambulatory Visit: Payer: Self-pay | Admitting: *Deleted

## 2023-11-28 ENCOUNTER — Encounter: Payer: Self-pay | Admitting: Gastroenterology

## 2023-11-28 DIAGNOSIS — R933 Abnormal findings on diagnostic imaging of other parts of digestive tract: Secondary | ICD-10-CM

## 2023-11-28 DIAGNOSIS — R7612 Nonspecific reaction to cell mediated immunity measurement of gamma interferon antigen response without active tuberculosis: Secondary | ICD-10-CM

## 2023-12-06 ENCOUNTER — Ambulatory Visit (INDEPENDENT_AMBULATORY_CARE_PROVIDER_SITE_OTHER): Admitting: Internal Medicine

## 2023-12-06 ENCOUNTER — Encounter: Payer: Self-pay | Admitting: Internal Medicine

## 2023-12-06 ENCOUNTER — Telehealth: Payer: Self-pay | Admitting: *Deleted

## 2023-12-06 ENCOUNTER — Other Ambulatory Visit: Payer: Self-pay

## 2023-12-06 VITALS — BP 126/80 | HR 85 | Temp 97.7°F | Wt 144.2 lb

## 2023-12-06 DIAGNOSIS — Z227 Latent tuberculosis: Secondary | ICD-10-CM

## 2023-12-06 DIAGNOSIS — R933 Abnormal findings on diagnostic imaging of other parts of digestive tract: Secondary | ICD-10-CM

## 2023-12-06 DIAGNOSIS — R1031 Right lower quadrant pain: Secondary | ICD-10-CM

## 2023-12-06 MED ORDER — PYRIDOXINE HCL 50 MG PO TABS: 50.0000 mg | ORAL_TABLET | Freq: Every day | ORAL | 8 refills | Status: AC

## 2023-12-06 MED ORDER — ISONIAZID 300 MG PO TABS: 300.0000 mg | ORAL_TABLET | Freq: Every day | ORAL | 8 refills | Status: DC

## 2023-12-06 NOTE — Telephone Encounter (Signed)
 I have spoken to patient who is in agreement with rescheduling colonoscopy now that she has been cleared by ID. Patient has scheduled 01/06/24 procedure. Patient has been advised of time/date/location for upcoming procedure and has been given generalized verbal prep instructions. Discussed that a care partner 18 years or older should bring her, stay for the procedure and drive home due to sedation. Written instructions have been made available to the patient for additional review via mychart. She already has Sutab on hand so no new prescription is needed for that.

## 2023-12-06 NOTE — Progress Notes (Signed)
 [facility]      Reason for Consult: Latent TB    Referring Physician: Dr. Adela Lank    Patient ID: Mackenzie Floyd, female    DOB: Feb 12, 1971, 53 y.o.   MRN: [id number]  HPI:   Mackenzie Floyd is here for evaluation of a positive QuantiFERON gold test.  She has had this repeated x 2.  She is followed by gastroenterologist for some abdominal pain and workup is concerning for Crohn's disease of the ileum with some inflammation.  She underwent scan plan for colonoscopy upcoming.  She was sent here due to the positive test.  She had a chest x-ray which was negative for any concerns.  She does not have any recent fever, night sweats.  She does not have any recollection of exposure to somebody with tuberculosis.  She is originally from the [place] and has been here since 2004.  She has had a positive PPD in the past as well but has never been treated for latent TB.  She has had a little bit of weight loss since starting on modified diet per GI instructions.  PMH: abdominal pain  Prior to Admission medications   Medication Sig Start Date End Date Taking? Authorizing Provider  isoniazid (NYDRAZID) 300 MG tablet Take 1 tablet (300 mg total) by mouth daily. 12/06/23  Yes Hava Massingale, Belia Heman, MD  Peppermint Oil (IBGARD) 90 MG CPCR Take as directed as needed. 11/18/23  Yes Armbruster, Willaim Rayas, MD  Probiotic Product (PROBIOTIC DAILY PO) Take 1 capsule by mouth daily. Instructions are take 1 capsule for 4 days and then increase to 2 capsules 10/11/23  Yes [provider]  pyridOXINE (B-6) 50 MG tablet Take 1 tablet (50 mg total) by mouth daily. 12/06/23  Yes Oberia Beaudoin, Belia Heman, MD  Sodium Sulfate-Mag Sulfate-KCl (SUTAB) [phone] MG TABS Use as directed for colonoscopy. MANUFACTURER CODES!! BIN: [id number] PCN: [id number] GROUP: [id number] MEMBER ID: [id number];[employer] AS SECONDARY INSURANCE ;NO PRIOR AUTHORIZATION Patient not taking: Reported on 12/06/2023 11/18/23   Armbruster, Willaim Rayas, MD     No Known Allergies  Social History   Tobacco Use   Smoking status: Never   Smokeless tobacco: Never  Vaping Use   Vaping status: Never Used  Substance Use Topics   Alcohol use: Not Currently    Alcohol/week: 0.0 - 2.0 standard drinks of alcohol    Comment: socially beer and wine   Drug use: Never    Family History  Problem Relation Age of Onset   Colon polyps Father 63   Stroke Father    Hypertension Father    Colon cancer Neg Hx    Esophageal cancer Neg Hx    Prostate cancer Neg Hx    Rectal cancer Neg Hx    Stomach cancer Neg Hx      Review of Systems  Constitutional: negative for fevers, chills, sweats, fatigue, and anorexia Respiratory: negative for cough, sputum, or hemoptysis All other systems reviewed and are negative    Constitutional: in no apparent distress  Vitals:   12/06/23 1345  BP: 126/80  Pulse: 85  Temp: 97.7 F (36.5 C)  SpO2: 100%   EYES: anicteri Respiratory: normal respiratory effort GI: Soft   Labs: Lab Results  Component Value Date   WBC 3.6 (L) 11/18/2023   HGB 14.5 11/18/2023   HCT 44.0 11/18/2023   MCV 84.4 11/18/2023   PLT 136.0 (L) 11/18/2023    Lab Results  Component Value Date   CREATININE  0.79 09/16/2022   BUN 15 09/16/2022   NA 137 09/16/2022   K 4.4 09/16/2022   CL 102 09/16/2022   CO2 29 09/16/2022    Lab Results  Component Value Date   ALT 14 09/16/2022   AST 23 09/16/2022   ALKPHOS 85 09/16/2022   BILITOT 1.2 09/16/2022     Assessment: 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  She has never been on treatment.  Though there is possibility of ileal involvement with tuberculosis it seems much less likely now though is due to get a colonoscopy upcoming which will include biopsies and would include a biopsy for mycobacterial infection as well.  Otherwise though I think it is low likelihood and we will go ahead and start her on isoniazid  treatment which she can also take concomitantly with any immunosuppressive biologic medications indicated for her Crohn's disease, if that is the ultimate etiology.  She will take this along with the vitamin B6.  Will check her LFTs today and have her return in about 1 month   Plan: 1) will check her CMP and HIV test 2.  Will start her on isoniazid plus B6 No indication from infectious disease standpoint to postpone colonoscopy.  Chest x-ray is negative.

## 2023-12-06 NOTE — Telephone Encounter (Signed)
-----   Message from Benancio Deeds sent at 12/06/2023  2:56 PM EDT ----- Can someone help schedule this patient for first available colonoscopy with me in the [facility]?  She is cleared for colonoscopy by ID.  Thank you ----- Message ----- From: Gardiner Barefoot, MD Sent: 12/06/2023   2:13 PM EDT To: Benancio Deeds, MD; #

## 2023-12-07 LAB — COMPREHENSIVE METABOLIC PANEL
AG Ratio: 1.6 (calc) (ref 1.0–2.5)
ALT: 14 U/L (ref 6–29)
AST: 20 U/L (ref 10–35)
Albumin: 4.6 g/dL (ref 3.6–5.1)
Alkaline phosphatase (APISO): 88 U/L (ref 37–153)
BUN: 14 mg/dL (ref 7–25)
CO2: 30 mmol/L (ref 20–32)
Calcium: 9.9 mg/dL (ref 8.6–10.4)
Chloride: 103 mmol/L (ref 98–110)
Creat: 0.94 mg/dL (ref 0.50–1.03)
Globulin: 2.9 g/dL (ref 1.9–3.7)
Glucose, Bld: 89 mg/dL (ref 65–99)
Potassium: 4.1 mmol/L (ref 3.5–5.3)
Sodium: 140 mmol/L (ref 135–146)
Total Bilirubin: 1 mg/dL (ref 0.2–1.2)
Total Protein: 7.5 g/dL (ref 6.1–8.1)
eGFR: 73 mL/min/{1.73_m2} (ref >=60)

## 2023-12-07 LAB — HIV ANTIBODY (ROUTINE TESTING W REFLEX): HIV 1&2 Ab, 4th Generation: NONREACTIVE

## 2023-12-09 ENCOUNTER — Encounter: Admitting: Gastroenterology

## 2023-12-14 ENCOUNTER — Encounter: Payer: Self-pay | Admitting: Gastroenterology

## 2023-12-14 NOTE — Telephone Encounter (Signed)
 Contacted patient who is in agreement with moving colonoscopy appointment to 12/21/23 at 830 am. She states that she does already have her colonoscopy prep instructions for review as well.

## 2023-12-14 NOTE — Telephone Encounter (Signed)
===  View-only below this line=== ----- Message ----- From: Benancio Deeds, MD Sent: 12/14/2023   9:16 AM EDT To: [facility] Triage Subject: colonoscopy                                    Hi - this patient is on the wait list for colonoscopy. I have an opening on 4/9 for procedure - can someone call her to see if she could come in then for colonoscopy? Thanks

## 2023-12-18 ENCOUNTER — Encounter: Payer: Self-pay | Admitting: Certified Registered Nurse Anesthetist

## 2023-12-21 ENCOUNTER — Encounter: Payer: Self-pay | Admitting: Gastroenterology

## 2023-12-21 ENCOUNTER — Ambulatory Visit (AMBULATORY_SURGERY_CENTER): Admitting: Gastroenterology

## 2023-12-21 VITALS — BP 108/69 | HR 63 | Temp 98.4°F | Resp 11 | Ht 65.0 in | Wt 145.0 lb

## 2023-12-21 DIAGNOSIS — R933 Abnormal findings on diagnostic imaging of other parts of digestive tract: Secondary | ICD-10-CM

## 2023-12-21 DIAGNOSIS — K6289 Other specified diseases of anus and rectum: Secondary | ICD-10-CM

## 2023-12-21 DIAGNOSIS — Q438 Other specified congenital malformations of intestine: Secondary | ICD-10-CM | POA: Diagnosis not present

## 2023-12-21 DIAGNOSIS — K648 Other hemorrhoids: Secondary | ICD-10-CM | POA: Diagnosis not present

## 2023-12-21 DIAGNOSIS — K625 Hemorrhage of anus and rectum: Secondary | ICD-10-CM | POA: Diagnosis not present

## 2023-12-21 DIAGNOSIS — D124 Benign neoplasm of descending colon: Secondary | ICD-10-CM

## 2023-12-21 DIAGNOSIS — K529 Noninfective gastroenteritis and colitis, unspecified: Secondary | ICD-10-CM

## 2023-12-21 DIAGNOSIS — K635 Polyp of colon: Secondary | ICD-10-CM | POA: Diagnosis not present

## 2023-12-21 MED ORDER — SODIUM CHLORIDE 0.9 % IV SOLN: 500.0000 mL | INTRAVENOUS | Status: DC

## 2023-12-21 NOTE — Op Note (Signed)
 [name] Patient Name: Mackenzie Floyd Procedure Date: 12/21/2023 9:20 AM MRN: [id number] Endoscopist: Viviann Spare P. Adela Lank , MD, [id number] Age: 53 Referring MD:  Date of Birth: [birth year] Gender: Female Account #: [id number] Procedure:                Colonoscopy Indications:              Abnormal CT / MRE of the GI tract - ileitis,                            intermittent RLQ pain. Patient has positive                            quantiferon gold - started isoniazed per ID, CXR                            negative Medicines:                Monitored Anesthesia Care Procedure:                Pre-Anesthesia Assessment:                           - Prior to the procedure, a History and Physical                            was performed, and patient medications and                            allergies were reviewed. The patient's tolerance of                            previous anesthesia was also reviewed. The risks                            and benefits of the procedure and the sedation                            options and risks were discussed with the patient.                            All questions were answered, and informed consent                            was obtained. Prior Anticoagulants: The patient has                            taken no anticoagulant or antiplatelet agents. ASA                            Grade Assessment: I - A normal, healthy patient.                            After reviewing the risks and benefits, the patient  was deemed in satisfactory condition to undergo the                            procedure.                           After obtaining informed consent, the colonoscope                            was passed under direct vision. Throughout the                            procedure, the patient's blood pressure, pulse, and                            oxygen saturations were monitored continuously. The                             Olympus Scope SN: [id number] was introduced through                            the anus and advanced to the the terminal ileum,                            with identification of the appendiceal orifice and                            IC valve. The colonoscopy was performed without                            difficulty. The patient tolerated the procedure                            well. The quality of the bowel preparation was                            adequate. The terminal ileum, ileocecal valve,                            appendiceal orifice, and rectum were photographed. Scope In: 9:33:53 AM Scope Out: 10:00:41 AM Scope Withdrawal Time: 0 hours 21 minutes 9 seconds  Total Procedure Duration: 0 hours 26 minutes 48 seconds  Findings:                 The perianal and digital rectal examinations were                            normal.                           Diffuse moderate inflammation characterized by                            congestion (edema), erosions, erythema, friability  and granularity was found in the terminal ileum.                            Biopsies were taken with a cold forceps for                            histology and mycobacterial staining.                           A 5 mm polyp was found in the descending colon. The                            polyp was flat. The polyp was removed with a cold                            snare. Resection and retrieval were complete.                           Localized ? mild inflammation characterized by                            altered vascularity and granularity was found in                            the very distal rectum. Biopsies were taken with a                            cold forceps for histology.                           The colon was extremely tortuous.                           Internal hemorrhoids were found during                            retroflexion. The hemorrhoids were small.                            The exam was otherwise without abnormality. Complications:            No immediate complications. Estimated blood loss:                            Minimal. Estimated Blood Loss:     Estimated blood loss was minimal. Impression:               - Moderate inflammation was found in the ileum.                            Biopsied to rule out Crohn's, also ruling out TB /                            mycobacterial infection.                           -  One 5 mm polyp in the descending colon, removed                            with a cold snare. Resected and retrieved.                           - ? very mild inflammation was found in the distal                            rectum. Biopsied.                           - Tortuous colon.                           - Internal hemorrhoids.                           - The examination was otherwise normal. Recommendation:           - Patient has a contact number available for                            emergencies. The signs and symptoms of potential                            delayed complications were discussed with the                            patient. Return to normal activities tomorrow.                            Written discharge instructions were provided to the                            patient.                           - Resume previous diet.                           - Continue present medications.                           - Await pathology results. Viviann Spare P. Normajean Nash, MD 12/21/2023 10:06:56 AM This report has been signed electronically.

## 2023-12-21 NOTE — Progress Notes (Signed)
 Pt's states no medical or surgical changes since previsit or office visit.

## 2023-12-21 NOTE — Progress Notes (Signed)
 [facility] History and Physical   Primary Care Physician:  Georgina Quint, MD   Reason for Procedure:   Abnormal imaging GI tract - ileitis  Plan:    colonoscopy     HPI: Mackenzie Floyd is a 53 y.o. female  here for colonoscopy to further evaluate CT / MRE changes - ileitis. Positive quantiferon gold x 2, has been seen by ID and started on isoniazid.     Patient denies any bowel symptoms at this time. Occasional RLQ pain.  No family history of colon cancer known. Otherwise feels well without any cardiopulmonary symptoms.   I have discussed risks / benefits of anesthesia and endoscopic procedure with Mackenzie Floyd and they wish to proceed with the exams as outlined today.    History reviewed. No pertinent past medical history.  Past Surgical History:  Procedure Laterality Date   CESAREAN SECTION N/A 1997 and 2008   Phreesia 03/04/2020   TONSILLECTOMY  1985    Prior to Admission medications   Medication Sig Start Date End Date Taking? Authorizing Provider  isoniazid (NYDRAZID) 300 MG tablet Take 1 tablet (300 mg total) by mouth daily. 12/06/23  Yes Comer, Belia Heman, MD  Probiotic Product (PROBIOTIC DAILY PO) Take 1 capsule by mouth daily. Instructions are take 1 capsule for 4 days and then increase to 2 capsules 10/11/23  Yes [provider]  pyridOXINE (B-6) 50 MG tablet Take 1 tablet (50 mg total) by mouth daily. 12/06/23  Yes Comer, Belia Heman, MD  Sodium Sulfate-Mag Sulfate-KCl (SUTAB) [phone] MG TABS Use as directed for colonoscopy. MANUFACTURER CODES!! BIN: [id number] PCN: CN GROUP: [id number] MEMBER ID: [id number];ZDG AS SECONDARY INSURANCE ;NO PRIOR AUTHORIZATION 11/18/23  Yes Blade Scheff, Willaim Rayas, MD  Peppermint Oil (IBGARD) 90 MG CPCR Take as directed as needed. 11/18/23   Onaje Warne, Willaim Rayas, MD    Current Outpatient Medications  Medication Sig Dispense Refill   isoniazid (NYDRAZID) 300 MG tablet Take 1 tablet (300 mg total) by mouth daily. 60 tablet  8   Probiotic Product (PROBIOTIC DAILY PO) Take 1 capsule by mouth daily. Instructions are take 1 capsule for 4 days and then increase to 2 capsules     pyridOXINE (B-6) 50 MG tablet Take 1 tablet (50 mg total) by mouth daily. 30 tablet 8   Sodium Sulfate-Mag Sulfate-KCl (SUTAB) [phone] MG TABS Use as directed for colonoscopy. MANUFACTURER CODES!! BIN: [id number] PCN: CN GROUP: [id number] MEMBER ID: [id number];SAY AS SECONDARY INSURANCE ;NO PRIOR AUTHORIZATION 24 tablet 0   Peppermint Oil (IBGARD) 90 MG CPCR Take as directed as needed.     Current Facility-Administered Medications  Medication Dose Route Frequency Provider Last Rate Last Admin   0.9 %  sodium chloride infusion  500 mL Intravenous Continuous Lestat Golob, Willaim Rayas, MD        Allergies as of 12/21/2023   (No Known Allergies)    Family History  Problem Relation Age of Onset   Varicose Veins Mother    Colon polyps Father 48   Stroke Father    Hypertension Father    Colon cancer Neg Hx    Esophageal cancer Neg Hx    Prostate cancer Neg Hx    Rectal cancer Neg Hx    Stomach cancer Neg Hx     Social History   Socioeconomic History   Marital status: Married    Spouse name: Not on file   Number of children: 2   Years of education: Not on file   Highest education level:  Bachelor's degree (e.g., BA, AB, BS)  Occupational History   Not on file  Tobacco Use   Smoking status: Never   Smokeless tobacco: Never  Vaping Use   Vaping status: Never Used  Substance and Sexual Activity   Alcohol use: Not Currently    Alcohol/week: 0.0 - 2.0 standard drinks of alcohol    Comment: socially beer and wine   Drug use: Never   Sexual activity: Not on file  Other Topics Concern   Not on file  Social History Narrative   Not on file   Social Drivers of Health   Financial Resource Strain: Low Risk  (10/05/2023)   Overall Financial Resource Strain (CARDIA)    Difficulty of Paying Living Expenses: Not very hard  Food  Insecurity: No Food Insecurity (10/05/2023)   Hunger Vital Sign    Worried About Running Out of Food in the Last Year: Never true    Ran Out of Food in the Last Year: Never true  Transportation Needs: No Transportation Needs (10/05/2023)   PRAPARE - Administrator, Civil Service (Medical): No    Lack of Transportation (Non-Medical): No  Physical Activity: Sufficiently Active (10/05/2023)   Exercise Vital Sign    Days of Exercise per Week: 5 days    Minutes of Exercise per Session: 40 min  Stress: No Stress Concern Present (10/05/2023)   [employer] of Occupational Health - Occupational Stress Questionnaire    Feeling of Stress : Not at all  Social Connections: Moderately Integrated (10/05/2023)   Social Connection and Isolation Panel [NHANES]    Frequency of Communication with Friends and Family: More than three times a week    Frequency of Social Gatherings with Friends and Family: Three times a week    Attends Religious Services: More than 4 times per year    Active Member of Clubs or Organizations: No    Attends Banker Meetings: Not on file    Marital Status: Married  Intimate Partner Violence: Unknown (12/18/2021)   Received from [facility], [facility]   HITS    Physically Hurt: Not on file    Insult or Talk Down To: Not on file    Threaten Physical Harm: Not on file    Scream or Curse: Not on file    Review of Systems: All other review of systems negative except as mentioned in the HPI.  Physical Exam: Vital signs BP 117/84   Pulse 67   Temp 98.4 F (36.9 C) (Temporal)   Resp 10   Ht 5\' 5"  (1.651 m)   Wt 145 lb (65.8 kg)   LMP 11/29/2023 (Within Days)   SpO2 100%   BMI 24.13 kg/m   General:   Alert,  Well-developed, pleasant and cooperative in NAD Lungs:  Clear throughout to auscultation.   Heart:  Regular rate and rhythm Abdomen:  Soft, nontender and nondistended.   Neuro/Psych:  Alert and cooperative. Normal mood and affect. A  and O x 3  Harlin Rain, MD [facility]

## 2023-12-21 NOTE — Progress Notes (Signed)
 Called to room to assist during endoscopic procedure.  Patient ID and intended procedure confirmed with present staff. Received instructions for my participation in the procedure from the performing physician.

## 2023-12-21 NOTE — Progress Notes (Signed)
 Report given to PACU, vss

## 2023-12-21 NOTE — Patient Instructions (Signed)

## 2023-12-22 ENCOUNTER — Telehealth: Payer: Self-pay

## 2023-12-22 NOTE — Telephone Encounter (Signed)
  Follow up Call-     12/21/2023    8:56 AM 01/20/2022   10:49 AM  Call back number  Post procedure Call Back phone  # [phone]  Permission to leave phone message Yes Yes     Patient questions:  Do you have a fever, pain , or abdominal swelling? No. Pain Score  0 *  Have you tolerated food without any problems? Yes.    Have you been able to return to your normal activities? Yes.    Do you have any questions about your discharge instructions: Diet   No. Medications  No. Follow up visit  No.  Do you have questions or concerns about your Care? No.  Actions: * If pain score is 4 or above: No action needed, pain <4.

## 2023-12-27 LAB — SURGICAL PATHOLOGY

## 2023-12-28 ENCOUNTER — Other Ambulatory Visit: Payer: Self-pay | Admitting: Internal Medicine

## 2024-01-05 ENCOUNTER — Ambulatory Visit: Admitting: Internal Medicine

## 2024-01-05 ENCOUNTER — Encounter: Admitting: Gastroenterology

## 2024-01-06 ENCOUNTER — Encounter: Payer: Self-pay | Admitting: Infectious Diseases

## 2024-01-06 ENCOUNTER — Other Ambulatory Visit: Payer: Self-pay

## 2024-01-06 ENCOUNTER — Ambulatory Visit (INDEPENDENT_AMBULATORY_CARE_PROVIDER_SITE_OTHER): Admitting: Infectious Diseases

## 2024-01-06 VITALS — BP 117/77 | HR 81 | Temp 97.7°F | Ht 65.0 in | Wt 144.0 lb

## 2024-01-06 DIAGNOSIS — Z227 Latent tuberculosis: Secondary | ICD-10-CM | POA: Diagnosis not present

## 2024-01-06 DIAGNOSIS — Z5181 Encounter for therapeutic drug level monitoring: Secondary | ICD-10-CM | POA: Insufficient documentation

## 2024-01-06 NOTE — Progress Notes (Signed)
 [facility]                                      [address], [place], [name], [address]                                               Phn. [phone]; Fax: [phone]                                                               Date: 01/06/24 Reason for Visit: Latent TB fu    HPI: Mackenzie Floyd is a 53 y.o.old female with who is here for latent T fu. Seen by Dr Seymour Dapper 3/25 and was started on Isoniazid  and Vit B6 which she reports starting to take same day without missing any doses or any concerns.  She has feeling of some muscle pulling at the back of her neck but likely unrelated.  She had a colonoscopy done on 12/21/2023 with findings as below, with findings s/o crohn's disease, no concerns for malignancy or TB.  Reviewed side effects of isoniazid .  She has no complaints today.   ROS: Denies yellowish discoloration of sclera and skin, abdominal pain/distension, hematemesis.            Denies fever, chills, nightsweats, nausea, vomiting, diarrhea, constipation, weight loss, recent hospitalizations, rashes, joint complaints, shortness of breath, cough, chest pain, headaches, GU complaints.   Current Outpatient Medications on File Prior to Visit  Medication Sig Dispense Refill   isoniazid  (NYDRAZID ) 300 MG tablet TOME 1 TABLETA POR VIA ORAL TODOS LOS DIAS 90 tablet 0   Probiotic Product (PROBIOTIC DAILY PO) Take 1 capsule by mouth daily. Instructions are take 1 capsule for 4 days and then increase to 2 capsules     pyridOXINE  (B-6) 50 MG tablet Take 1 tablet (50 mg total) by mouth daily. 30 tablet 8   Peppermint Oil (IBGARD) 90 MG CPCR Take as directed as needed. (Patient not taking: Reported on 01/06/2024)     Sodium Sulfate-Mag Sulfate-KCl (SUTAB ) [phone] MG TABS Use as directed for colonoscopy. MANUFACTURER CODES!! BIN: [id number] PCN: CN GROUP: [id number] MEMBER ID: [id number];DGU AS SECONDARY INSURANCE ;NO PRIOR  AUTHORIZATION (Patient not taking: Reported on 01/06/2024) 24 tablet 0   No current facility-administered medications on file prior to visit.   No Known Allergies  No past medical history on file.  Past Surgical History:  Procedure Laterality Date   CESAREAN SECTION N/A 1997 and 2008   Phreesia 03/04/2020   TONSILLECTOMY  1985   Social History   Socioeconomic History   Marital status: Married    Spouse name: Not on file   Number of children: 2   Years of education: Not on file   Highest education level: Bachelor's degree (e.g., BA, AB, BS)  Occupational History   Not on file  Tobacco Use   Smoking status: Never   Smokeless tobacco: Never  Vaping Use   Vaping status: Never Used  Substance and Sexual Activity   Alcohol use: Not Currently    Alcohol/week:  0.0 - 2.0 standard drinks of alcohol    Comment: socially beer and wine   Drug use: Never   Sexual activity: Not on file  Other Topics Concern   Not on file  Social History Narrative   Not on file   Social Drivers of Health   Financial Resource Strain: Low Risk  (10/05/2023)   Overall Financial Resource Strain (CARDIA)    Difficulty of Paying Living Expenses: Not very hard  Food Insecurity: No Food Insecurity (10/05/2023)   Hunger Vital Sign    Worried About Running Out of Food in the Last Year: Never true    Ran Out of Food in the Last Year: Never true  Transportation Needs: No Transportation Needs (10/05/2023)   PRAPARE - Administrator, Civil Service (Medical): No    Lack of Transportation (Non-Medical): No  Physical Activity: Sufficiently Active (10/05/2023)   Exercise Vital Sign    Days of Exercise per Week: 5 days    Minutes of Exercise per Session: 40 min  Stress: No Stress Concern Present (10/05/2023)   [employer] of Occupational Health - Occupational Stress Questionnaire    Feeling of Stress : Not at all  Social Connections: Moderately Integrated (10/05/2023)   Social Connection and  Isolation Panel [NHANES]    Frequency of Communication with Friends and Family: More than three times a week    Frequency of Social Gatherings with Friends and Family: Three times a week    Attends Religious Services: More than 4 times per year    Active Member of Clubs or Organizations: No    Attends Banker Meetings: Not on file    Marital Status: Married  Intimate Partner Violence: Unknown (12/18/2021)   Received from [employer], [facility]   HITS    Physically Hurt: Not on file    Insult or Talk Down To: Not on file    Threaten Physical Harm: Not on file    Scream or Curse: Not on file   Family History  Problem Relation Age of Onset   Varicose Veins Mother    Colon polyps Father 19   Stroke Father    Hypertension Father    Colon cancer Neg Hx    Esophageal cancer Neg Hx    Prostate cancer Neg Hx    Rectal cancer Neg Hx    Stomach cancer Neg Hx    Vitals BP 117/77   Pulse 81   Temp 97.7 F (36.5 C) (Temporal)   Ht 5\' 5"  (1.651 m)   Wt 144 lb (65.3 kg)   LMP 11/29/2023 (Within Days)   SpO2 100%   BMI 23.96 kg/m    Gen: Alert and oriented x 3, no acute distress HEENT: [place]/AT, no scleral icterus, no pale conjunctivae, hearing normal, oral mucosa moist Neck: Supple Cardio: Regular rate and rhythm; +S1 and S2 Resp: CTAB; no wheezes, rhonchi, or rales GI: Soft, nontender, nondistended,  GU: Musc: Extremities: No pedal edema Skin: No obvious rashes Neuro: grossly non focal, awake, alert and oriented  Psych: Calm, cooperative  Laboratory  CBC    Component Value Date/Time   WBC 3.6 (L) 11/18/2023 0914   RBC 5.21 (H) 11/18/2023 0914   HGB 14.5 11/18/2023 0914   HCT 44.0 11/18/2023 0914   PLT 136.0 (L) 11/18/2023 0914   MCV 84.4 11/18/2023 0914   MCHC 33.0 11/18/2023 0914   RDW 13.8 11/18/2023 0914   LYMPHSABS 0.9 11/18/2023 0914   MONOABS 0.5 11/18/2023 0914  EOSABS 0.2 11/18/2023 0914   BASOSABS 0.0 11/18/2023 0914      Latest Ref Rng  & Units 12/06/2023    2:11 PM 09/16/2022    9:35 AM 07/07/2022    9:14 AM  CMP  Glucose 65 - 99 mg/dL 89  81    BUN 7 - 25 mg/dL 14  15    Creatinine 1.61 - 1.03 mg/dL 0.96  0.45    Sodium 409 - 146 mmol/L 140  137    Potassium 3.5 - 5.3 mmol/L 4.1  4.4    Chloride 98 - 110 mmol/L 103  102    CO2 20 - 32 mmol/L 30  29    Calcium 8.6 - 10.4 mg/dL 9.9  9.7    Total Protein 6.1 - 8.1 g/dL 7.5  7.7  7.3   Total Bilirubin 0.2 - 1.2 mg/dL 1.0  1.2  1.0   Alkaline Phos 39 - 117 U/L  85  94   AST 10 - 35 U/L 20  23  21    ALT 6 - 29 U/L 14  14  12     Pathology  FINAL DIAGNOSIS       1. Surgical [P], small bowel, ileum :      - MILDLY ACTIVE CHRONIC ILEITIS, CONSISTENT WITH PATIENT'S CLINICAL HISTORY OF      CROHN'S DISEASE      - NEGATIVE FOR GRANULOMAS OR DYSPLASIA       2. Surgical [P], small bowel, ileum :      - MILDLY ACTIVE CHRONIC ILEITIS, CONSISTENT WITH PATIENT'S CLINICAL HISTORY OF      CROHN'S DISEASE      - NEGATIVE FOR GRANULOMAS OR DYSPLASIA       3. Surgical [P], colon, descending, polyp (1) :      - POLYPOID COLONIC MUCOSA WITH MILD HYPERPLASTIC CHANGES      - NEGATIVE FOR DYSPLASIA      - MULTIPLE STEP SECTIONS WERE EXAMINED       4. Surgical [P], colon, rectum bxs :      - RECTAL MUCOSA WITH NO SPECIFIC HISTOPATHOLOGIC CHANGES      - NEGATIVE FOR ACUTE INFLAMMATION, FEATURES OF CHRONICITY, GRANULOMAS OR      DYSPLASIA  Pertinent Imaging 11/28/23 Chest Xray  FINDINGS: The heart size and mediastinal contours are within normal limits. Both lungs are clear. The visualized skeletal structures are unremarkable.   IMPRESSION: No active cardiopulmonary disease.  Assessment/Plan: # Latent TB  - HIV NR - chest xray with no acute abnormality  - continue Isoniazid  and pyridoxine  as is  - Plan for 6 months  - Fu in 6-8 weeks   # Medication Monitoring  - labs today  # Possible crohns disease - not on any tx yet - Fu with GI   I have personally spent 40  minutes involved in face-to-face and non-face-to-face activities for this patient on the day of the visit. Professional time spent includes the following activities: Preparing to see the patient (review of tests), Obtaining and/or reviewing separately obtained history (admission/discharge record), Performing a medically appropriate examination and/or evaluation , Ordering medications/tests/procedures, referring and communicating with other health care professionals, Documenting clinical information in the EMR, Independently interpreting results (not separately reported), Communicating results to the patient/family/caregiver, Counseling and educating the patient/family/caregiver and Care coordination (not separately reported).    Patients questions were addressed and answered.   Of note, portions of this note may have been created with voice recognition software. While this  note has been edited for accuracy, occasional wrong-word or 'sound-a-like' substitutions may have occurred due to the inherent limitations of voice recognition software.   Electronically signed by:  Terre Ferri, MD Infectious Diseases  Office phone [phone] Fax no. [phone]

## 2024-01-07 LAB — COMPREHENSIVE METABOLIC PANEL WITH GFR
AG Ratio: 1.5 (calc) (ref 1.0–2.5)
ALT: 18 U/L (ref 6–29)
AST: 24 U/L (ref 10–35)
Albumin: 4.3 g/dL (ref 3.6–5.1)
Alkaline phosphatase (APISO): 87 U/L (ref 37–153)
BUN: 18 mg/dL (ref 7–25)
CO2: 27 mmol/L (ref 20–32)
Calcium: 9.6 mg/dL (ref 8.6–10.4)
Chloride: 102 mmol/L (ref 98–110)
Creat: 0.74 mg/dL (ref 0.50–1.03)
Globulin: 2.9 g/dL (ref 1.9–3.7)
Glucose, Bld: 79 mg/dL (ref 65–99)
Potassium: 4.6 mmol/L (ref 3.5–5.3)
Sodium: 137 mmol/L (ref 135–146)
Total Bilirubin: 0.8 mg/dL (ref 0.2–1.2)
Total Protein: 7.2 g/dL (ref 6.1–8.1)
eGFR: 97 mL/min/{1.73_m2} (ref >=60)

## 2024-01-13 ENCOUNTER — Encounter: Payer: Self-pay | Admitting: Gastroenterology

## 2024-01-18 ENCOUNTER — Encounter: Payer: Self-pay | Admitting: Gastroenterology

## 2024-01-19 ENCOUNTER — Other Ambulatory Visit: Payer: Self-pay

## 2024-01-19 MED ORDER — BUDESONIDE 3 MG PO CPEP: ORAL_CAPSULE | ORAL | 0 refills | Status: AC

## 2024-02-08 ENCOUNTER — Other Ambulatory Visit: Payer: Self-pay | Admitting: Gastroenterology

## 2024-02-17 ENCOUNTER — Ambulatory Visit (INDEPENDENT_AMBULATORY_CARE_PROVIDER_SITE_OTHER): Admitting: Infectious Diseases

## 2024-02-17 ENCOUNTER — Other Ambulatory Visit: Payer: Self-pay

## 2024-02-17 ENCOUNTER — Other Ambulatory Visit: Payer: Self-pay | Admitting: Infectious Diseases

## 2024-02-17 ENCOUNTER — Encounter: Payer: Self-pay | Admitting: Infectious Diseases

## 2024-02-17 VITALS — BP 132/88 | HR 75 | Resp 16 | Ht 65.0 in | Wt 139.0 lb

## 2024-02-17 DIAGNOSIS — S300XXA Contusion of lower back and pelvis, initial encounter: Secondary | ICD-10-CM | POA: Diagnosis not present

## 2024-02-17 DIAGNOSIS — Z79899 Other long term (current) drug therapy: Secondary | ICD-10-CM | POA: Insufficient documentation

## 2024-02-17 DIAGNOSIS — T148XXA Other injury of unspecified body region, initial encounter: Secondary | ICD-10-CM | POA: Insufficient documentation

## 2024-02-17 DIAGNOSIS — Z227 Latent tuberculosis: Secondary | ICD-10-CM

## 2024-02-17 MED ORDER — ISONIAZID 300 MG PO TABS: 300.0000 mg | ORAL_TABLET | Freq: Every day | ORAL | 1 refills | Status: DC

## 2024-02-17 NOTE — Progress Notes (Addendum)
 [facility]                                      [address], [place], [name], [address]                                               Phn. [phone]; Fax: [phone]                                                               Date: 02/17/24 Reason for Visit: Latent TB fu   HPI: Mackenzie Floyd is a 53 y.o.old female with who is here for latent T fu. Seen by Dr Seymour Dapper 3/25 and was started on Isoniazid  and Vit B6 which she reports starting to take same day without missing any doses or any concerns.  She has feeling of some muscle pulling at the back of her neck but likely unrelated.  She had a colonoscopy done on 12/21/2023 with findings as below, with findings s/o crohn's disease, no concerns for malignancy or TB.  Reviewed side effects of isoniazid .  She has no complaints today.   6/6  Reports compliance with Isoniazid  and vit b6, however reports some bruising in her b/l buttocks without trauma. Reports h/o low platelets in the past. She is also taking budesonide  for a prolonged period (3mg  2 tabs starting today) per GI for possible crohn's disease.  She also reports some blurry vision lately despite her glasses and discussed to see Ophthalmologist but she would like to hold off on that. Denies numbness, tingling in the extremities. No other complaints.  ROS: Denies yellowish discoloration of sclera and skin, abdominal pain/distension, hematemesis.            Denies fever, chills, nightsweats, nausea, vomiting, diarrhea, constipation,rashes, joint complaints, shortness of breath, cough, chest pain, headaches, GU complaints.   Current Outpatient Medications on File Prior to Visit  Medication Sig Dispense Refill   budesonide  (ENTOCORT EC ) 3 MG 24 hr capsule Take 3 capsules (9 mg total) by mouth daily for 28 days, THEN 2 capsules (6 mg total) daily for 14 days, THEN 1 capsule (3 mg total) daily for 14 days. 126 capsule 0    isoniazid  (NYDRAZID ) 300 MG tablet TOME 1 TABLETA POR VIA ORAL TODOS LOS DIAS 90 tablet 0   Peppermint Oil (IBGARD) 90 MG CPCR Take as directed as needed.     Probiotic Product (PROBIOTIC DAILY PO) Take 1 capsule by mouth daily. Instructions are take 1 capsule for 4 days and then increase to 2 capsules     pyridOXINE  (B-6) 50 MG tablet Take 1 tablet (50 mg total) by mouth daily. 30 tablet 8   Sodium Sulfate-Mag Sulfate-KCl (SUTAB ) 1479-225-188 MG TABS Use as directed for colonoscopy. MANUFACTURER CODES!! BIN: [id number] PCN: CN GROUP: [id number] MEMBER ID: [id number];KGM AS SECONDARY INSURANCE ;NO PRIOR AUTHORIZATION 24 tablet 0   No current facility-administered medications on file prior to visit.   No Known Allergies  History reviewed. No pertinent past medical history.  Past Surgical History:  Procedure Laterality  Date   CESAREAN SECTION N/A 1997 and 2008   Phreesia 03/04/2020   TONSILLECTOMY  1985   Social History   Socioeconomic History   Marital status: Married    Spouse name: Not on file   Number of children: 2   Years of education: Not on file   Highest education level: Bachelor's degree (e.g., BA, AB, BS)  Occupational History   Not on file  Tobacco Use   Smoking status: Never   Smokeless tobacco: Never  Vaping Use   Vaping status: Never Used  Substance and Sexual Activity   Alcohol use: Not Currently    Alcohol/week: 0.0 - 2.0 standard drinks of alcohol    Comment: socially beer and wine   Drug use: Never   Sexual activity: Not on file  Other Topics Concern   Not on file  Social History Narrative   Not on file   Social Drivers of Health   Financial Resource Strain: Low Risk  (10/05/2023)   Overall Financial Resource Strain (CARDIA)    Difficulty of Paying Living Expenses: Not very hard  Food Insecurity: No Food Insecurity (10/05/2023)   Hunger Vital Sign    Worried About Running Out of Food in the Last Year: Never true    Ran Out of Food in the Last Year: Never  true  Transportation Needs: No Transportation Needs (10/05/2023)   PRAPARE - Administrator, Civil Service (Medical): No    Lack of Transportation (Non-Medical): No  Physical Activity: Sufficiently Active (10/05/2023)   Exercise Vital Sign    Days of Exercise per Week: 5 days    Minutes of Exercise per Session: 40 min  Stress: No Stress Concern Present (10/05/2023)   [employer] of Occupational Health - Occupational Stress Questionnaire    Feeling of Stress : Not at all  Social Connections: Moderately Integrated (10/05/2023)   Social Connection and Isolation Panel [NHANES]    Frequency of Communication with Friends and Family: More than three times a week    Frequency of Social Gatherings with Friends and Family: Three times a week    Attends Religious Services: More than 4 times per year    Active Member of Clubs or Organizations: No    Attends Banker Meetings: Not on file    Marital Status: Married  Intimate Partner Violence: Unknown (12/18/2021)   Received from [employer], [facility]   HITS    Physically Hurt: Not on file    Insult or Talk Down To: Not on file    Threaten Physical Harm: Not on file    Scream or Curse: Not on file   Family History  Problem Relation Age of Onset   Varicose Veins Mother    Colon polyps Father 42   Stroke Father    Hypertension Father    Colon cancer Neg Hx    Esophageal cancer Neg Hx    Prostate cancer Neg Hx    Rectal cancer Neg Hx    Stomach cancer Neg Hx    Vitals BP 132/88   Pulse 75   Resp 16   Ht 5\' 5"  (1.651 m)   Wt 139 lb (63 kg)   BMI 23.13 kg/m   Gen: Alert and oriented x 3, no acute distress HEENT: [place]/AT, no scleral icterus, no pale conjunctivae/icterus, hearing normal, oral mucosa moist Neck: Supple Cardio: Regular rate and rhythm Resp: CTAB GI: nondistended,  GU: Musc: Extremities: No pedal edema Skin: No obvious rashes Neuro: grossly  non focal, awake, alert and oriented  Psych:  Calm, cooperative  Laboratory  CBC    Component Value Date/Time   WBC 3.6 (L) 11/18/2023 0914   RBC 5.21 (H) 11/18/2023 0914   HGB 14.5 11/18/2023 0914   HCT 44.0 11/18/2023 0914   PLT 136.0 (L) 11/18/2023 0914   MCV 84.4 11/18/2023 0914   MCHC 33.0 11/18/2023 0914   RDW 13.8 11/18/2023 0914   LYMPHSABS 0.9 11/18/2023 0914   MONOABS 0.5 11/18/2023 0914   EOSABS 0.2 11/18/2023 0914   BASOSABS 0.0 11/18/2023 0914      Latest Ref Rng & Units 01/06/2024    8:43 AM 12/06/2023    2:11 PM 09/16/2022    9:35 AM  CMP  Glucose 65 - 99 mg/dL 79  89  81   BUN 7 - 25 mg/dL 18  14  15    Creatinine 0.50 - 1.03 mg/dL 4.09  8.11  9.14   Sodium 135 - 146 mmol/L 137  140  137   Potassium 3.5 - 5.3 mmol/L 4.6  4.1  4.4   Chloride 98 - 110 mmol/L 102  103  102   CO2 20 - 32 mmol/L 27  30  29    Calcium 8.6 - 10.4 mg/dL 9.6  9.9  9.7   Total Protein 6.1 - 8.1 g/dL 7.2  7.5  7.7   Total Bilirubin 0.2 - 1.2 mg/dL 0.8  1.0  1.2   Alkaline Phos 39 - 117 U/L   85   AST 10 - 35 U/L 24  20  23    ALT 6 - 29 U/L 18  14  14     Pathology  FINAL DIAGNOSIS       1. Surgical [P], small bowel, ileum :      - MILDLY ACTIVE CHRONIC ILEITIS, CONSISTENT WITH PATIENT'S CLINICAL HISTORY OF      CROHN'S DISEASE      - NEGATIVE FOR GRANULOMAS OR DYSPLASIA       2. Surgical [P], small bowel, ileum :      - MILDLY ACTIVE CHRONIC ILEITIS, CONSISTENT WITH PATIENT'S CLINICAL HISTORY OF      CROHN'S DISEASE      - NEGATIVE FOR GRANULOMAS OR DYSPLASIA       3. Surgical [P], colon, descending, polyp (1) :      - POLYPOID COLONIC MUCOSA WITH MILD HYPERPLASTIC CHANGES      - NEGATIVE FOR DYSPLASIA      - MULTIPLE STEP SECTIONS WERE EXAMINED       4. Surgical [P], colon, rectum bxs :      - RECTAL MUCOSA WITH NO SPECIFIC HISTOPATHOLOGIC CHANGES      - NEGATIVE FOR ACUTE INFLAMMATION, FEATURES OF CHRONICITY, GRANULOMAS OR      DYSPLASIA       ELECTRONIC SIGNATURE : Kashikar Md, Retail buyer, Sports administrator, Copywriter, advertising  MICROSCOPIC DESCRIPTION  CASE COMMENTS STAINS USED IN DIAGNOSIS: H&E H&E H&E *RECUT DEEPER X 9 LEVELS *RECUT DEEPER X 9 LEVELS *RECUT DEEPER X 9 LEVELS H&E Stains used in diagnosis 1 *Acid Fast Bacillus Stain, 2 *Acid Fast Bacillus Stain  ADDENDUM and B.   AFB stains are negative for acid fast bacilli. Pat Bonier, Pathologist, Electronic Signature [phone])   Pertinent Imaging 11/28/23 Chest Xray  FINDINGS: The heart size and mediastinal contours are within normal limits. Both lungs are clear. The visualized skeletal structures are unremarkable.   IMPRESSION: No active cardiopulmonary disease.  Assessment/Plan: #  Latent TB  - HIV NR - chest xray with no acute abnormality  - continue Isoniazid  and pyridoxine  as is  - Plan for 6 months through 9/25, additional refills sent to complete course  - No restrictions if biologics needed from ID standpoint as already 1+ month into tx.  - Fu in a month   # Bruises  - bruises noted in b/l buttocks, no trauma,h/o low platelets  - on prolonged budesonide , discussed to fu with GI regarding the bruises as steroids could be contributing. Less likely isoniazid , uncommon side effect - CBC today  - Fu in a month   # Medication Monitoring  - labs today  # Possible crohns disease - on prolonged course of PO budesonide , started since 5/8 - Fu with GI   I spent 25 minutes involved in face-to-face and non-face-to-face activities for this patient on the day of the visit. Professional time spent includes the following activities: Preparing to see the patient (review of tests) Performing a medically appropriate examination and/or evaluation , Ordering medications/labs,  Documenting clinical information in the EMR, Independently interpreting results (not separately reported), Communicating results to the patient, Counseling and educating the patient and Care coordination (not separately reported).    Patients  questions were addressed and answered.   Of note, portions of this note may have been created with voice recognition software. While this note has been edited for accuracy, occasional wrong-word or 'sound-a-like' substitutions may have occurred due to the inherent limitations of voice recognition software.   Electronically signed by:  Terre Ferri, MD Infectious Diseases  Office phone [phone] Fax no. [phone]

## 2024-02-18 LAB — COMPREHENSIVE METABOLIC PANEL WITH GFR
AG Ratio: 1.6 (calc) (ref 1.0–2.5)
ALT: 20 U/L (ref 6–29)
AST: 22 U/L (ref 10–35)
Albumin: 4.7 g/dL (ref 3.6–5.1)
Alkaline phosphatase (APISO): 75 U/L (ref 37–153)
BUN/Creatinine Ratio: 19 (calc) (ref 6–22)
BUN: 20 mg/dL (ref 7–25)
CO2: 27 mmol/L (ref 20–32)
Calcium: 10.1 mg/dL (ref 8.6–10.4)
Chloride: 101 mmol/L (ref 98–110)
Creat: 1.04 mg/dL — ABNORMAL HIGH (ref 0.50–1.03)
Globulin: 3 g/dL (ref 1.9–3.7)
Glucose, Bld: 77 mg/dL (ref 65–99)
Potassium: 4.5 mmol/L (ref 3.5–5.3)
Sodium: 137 mmol/L (ref 135–146)
Total Bilirubin: 1.3 mg/dL — ABNORMAL HIGH (ref 0.2–1.2)
Total Protein: 7.7 g/dL (ref 6.1–8.1)
eGFR: 65 mL/min/{1.73_m2} (ref >=60)

## 2024-02-18 LAB — CBC
HCT: 48 % — ABNORMAL HIGH (ref 35.0–45.0)
Hemoglobin: 14.9 g/dL (ref 11.7–15.5)
MCH: 26.9 pg — ABNORMAL LOW (ref 27.0–33.0)
MCHC: 31 g/dL — ABNORMAL LOW (ref 32.0–36.0)
MCV: 86.6 fL (ref 80.0–100.0)
MPV: 12.2 fL (ref 7.5–12.5)
Platelets: 154 10*3/uL (ref 140–400)
RBC: 5.54 10*6/uL — ABNORMAL HIGH (ref 3.80–5.10)
RDW: 13.2 % (ref 11.0–15.0)
WBC: 3.5 10*3/uL — ABNORMAL LOW (ref 3.8–10.8)

## 2024-02-20 ENCOUNTER — Ambulatory Visit: Payer: Self-pay | Admitting: Infectious Diseases

## 2024-02-20 NOTE — Addendum Note (Signed)
 Addended by: Arlon Bergamo D on: 02/20/2024 09:08 AM   Modules accepted: Orders

## 2024-02-20 NOTE — Telephone Encounter (Signed)
 Spoke with patient, relayed slightly elevated creatinine and need for adequate hydration. She states she drinks water frequently, but has noticed recently that her urine is more yellow and has more of an odor to it. Scheduled for repeat labs 6/25.  Relayed that WBC is mildly low at 3.5, but that she has had this in the past and that provider would monitor. Relayed total bilirubin is slightly elevated, but LFTs are normal and that provider will monitor.  Discussed that platelets are normal and that provider suspects bruise is due to budesonide , reminded her to reach out to GI.   Patient verbalized understanding and has no further questions.   Telesforo Brosnahan, BSN, RN

## 2024-02-27 ENCOUNTER — Encounter: Payer: Self-pay | Admitting: Gastroenterology

## 2024-02-27 ENCOUNTER — Other Ambulatory Visit: Payer: Self-pay

## 2024-02-27 DIAGNOSIS — T148XXA Other injury of unspecified body region, initial encounter: Secondary | ICD-10-CM

## 2024-03-01 NOTE — Telephone Encounter (Signed)
 Dr Gillian Lacrosse,   Please see patient request to have CBC, INR recommended by Dr General Kenner to be completed at ID lab. Are you okay with drawing these while patient is at your office for [facility] labs on 6/25?

## 2024-03-07 ENCOUNTER — Other Ambulatory Visit

## 2024-03-07 ENCOUNTER — Other Ambulatory Visit: Payer: Self-pay

## 2024-03-07 DIAGNOSIS — T148XXA Other injury of unspecified body region, initial encounter: Secondary | ICD-10-CM | POA: Diagnosis not present

## 2024-03-07 DIAGNOSIS — Z79899 Other long term (current) drug therapy: Secondary | ICD-10-CM

## 2024-03-07 DIAGNOSIS — Z5181 Encounter for therapeutic drug level monitoring: Secondary | ICD-10-CM | POA: Diagnosis not present

## 2024-03-08 LAB — PROTIME-INR
INR: 1.1
Prothrombin Time: 11.4 s (ref 9.0–11.5)

## 2024-03-08 LAB — BASIC METABOLIC PANEL WITH GFR
BUN: 16 mg/dL (ref 7–25)
CO2: 27 mmol/L (ref 20–32)
Calcium: 9.8 mg/dL (ref 8.6–10.4)
Chloride: 97 mmol/L — ABNORMAL LOW (ref 98–110)
Creat: 0.97 mg/dL (ref 0.50–1.03)
Glucose, Bld: 70 mg/dL (ref 65–99)
Potassium: 3.9 mmol/L (ref 3.5–5.3)
Sodium: 134 mmol/L — ABNORMAL LOW (ref 135–146)
eGFR: 70 mL/min/{1.73_m2} (ref >=60)

## 2024-03-08 LAB — CBC WITH DIFFERENTIAL/PLATELET
Absolute Lymphocytes: 955 {cells}/uL (ref 850–3900)
Absolute Monocytes: 221 {cells}/uL (ref 200–950)
Basophils Absolute: 31 {cells}/uL (ref 0–200)
Basophils Relative: 1.1 %
Eosinophils Absolute: 101 {cells}/uL (ref 15–500)
Eosinophils Relative: 3.6 %
HCT: 46.8 % — ABNORMAL HIGH (ref 35.0–45.0)
Hemoglobin: 14.7 g/dL (ref 11.7–15.5)
MCH: 26.9 pg — ABNORMAL LOW (ref 27.0–33.0)
MCHC: 31.4 g/dL — ABNORMAL LOW (ref 32.0–36.0)
MCV: 85.7 fL (ref 80.0–100.0)
MPV: 12.5 fL (ref 7.5–12.5)
Monocytes Relative: 7.9 %
Neutro Abs: 1492 {cells}/uL — ABNORMAL LOW (ref 1500–7800)
Neutrophils Relative %: 53.3 %
Platelets: 144 10*3/uL (ref 140–400)
RBC: 5.46 10*6/uL — ABNORMAL HIGH (ref 3.80–5.10)
RDW: 13.1 % (ref 11.0–15.0)
Total Lymphocyte: 34.1 %
WBC: 2.8 10*3/uL — ABNORMAL LOW (ref 3.8–10.8)

## 2024-03-23 ENCOUNTER — Encounter: Payer: Self-pay | Admitting: Gastroenterology

## 2024-03-23 ENCOUNTER — Ambulatory Visit (INDEPENDENT_AMBULATORY_CARE_PROVIDER_SITE_OTHER): Admitting: Gastroenterology

## 2024-03-23 ENCOUNTER — Other Ambulatory Visit (INDEPENDENT_AMBULATORY_CARE_PROVIDER_SITE_OTHER)

## 2024-03-23 VITALS — BP 108/74 | HR 60 | Ht 65.0 in | Wt 137.2 lb

## 2024-03-23 DIAGNOSIS — K50018 Crohn's disease of small intestine with other complication: Secondary | ICD-10-CM

## 2024-03-23 DIAGNOSIS — T148XXA Other injury of unspecified body region, initial encounter: Secondary | ICD-10-CM

## 2024-03-23 DIAGNOSIS — Z227 Latent tuberculosis: Secondary | ICD-10-CM | POA: Diagnosis not present

## 2024-03-23 LAB — CBC WITH DIFFERENTIAL/PLATELET
Basophils Absolute: 0 K/uL (ref 0.0–0.1)
Basophils Relative: 1 % (ref 0.0–3.0)
Eosinophils Absolute: 0.1 K/uL (ref 0.0–0.7)
Eosinophils Relative: 4.2 % (ref 0.0–5.0)
HCT: 43.2 % (ref 36.0–46.0)
Hemoglobin: 14.1 g/dL (ref 12.0–15.0)
Lymphocytes Relative: 28.2 % (ref 12.0–46.0)
Lymphs Abs: 0.9 K/uL (ref 0.7–4.0)
MCHC: 32.8 g/dL (ref 30.0–36.0)
MCV: 82.4 fl (ref 78.0–100.0)
Monocytes Absolute: 0.2 K/uL (ref 0.1–1.0)
Monocytes Relative: 7.8 % (ref 3.0–12.0)
Neutro Abs: 1.8 K/uL (ref 1.4–7.7)
Neutrophils Relative %: 58.8 % (ref 43.0–77.0)
Platelets: 125 K/uL — ABNORMAL LOW (ref 150.0–400.0)
RBC: 5.24 Mil/uL — ABNORMAL HIGH (ref 3.87–5.11)
RDW: 14.3 % (ref 11.5–15.5)
WBC: 3 K/uL — ABNORMAL LOW (ref 4.0–10.5)

## 2024-03-23 LAB — PROTIME-INR
INR: 1 ratio (ref 0.8–1.0)
Prothrombin Time: 11 s (ref 9.6–13.1)

## 2024-03-23 NOTE — Patient Instructions (Addendum)
 Please go to the lab in the basement of our building to have lab work done as you leave today. Hit B for basement when you get on the elevator.  When the doors open the lab is on your left.  We will call you with the results. Thank you.  Please visit the Crohn's [employer] website   [email or web address] Review information regarding [place]   Thank you for entrusting me with your care and for choosing [facility], Dr. Elspeth Naval    If your blood pressure at your visit was 140/90 or greater, please contact your primary care physician to follow up on this. ______________________________________________________  If you are age 76 or older, your body mass index should be between 23-30. Your Body mass index is 22.84 kg/m. If this is out of the aforementioned range listed, please consider follow up with your Primary Care Provider.  If you are age 75 or younger, your body mass index should be between 19-25. Your Body mass index is 22.84 kg/m. If this is out of the aformentioned range listed, please consider follow up with your Primary Care Provider.  ________________________________________________________  The [facility] providers would like to encourage you to use MYCHART to communicate with providers for non-urgent requests or questions.  Due to long hold times on the telephone, sending your provider a message by [facility] may be a faster and more efficient way to get a response.  Please allow 48 business hours for a response.  Please remember that this is for non-urgent requests.  _______________________________________________________  Due to recent changes in healthcare laws, you may see the results of your imaging and laboratory studies on MyChart before your provider has had a chance to review them.  We understand that in some cases there may be results that are confusing or concerning to you. Not all laboratory results come back in the same time  frame and the provider may be waiting for multiple results in order to interpret others.  Please give us  48 hours in order for your provider to thoroughly review all the results before contacting the office for clarification of your results.

## 2024-03-23 NOTE — Progress Notes (Signed)
 HPI :  53 year old female here for follow-up visit for Crohn's disease.  Recall she was seen in the emergency room in January for abdominal pain, CT scan showed ileitis concerning for possible Crohn's disease.  She had normal labs at the time. Follow-up MR enterography which was done on February 25, showing short segment wall thickening in mucosal hyperenhancement with a 7 cm segment of ileum from the IC valve.  Changes very consistent with Crohn's disease.  No obvious active bowel obstruction.  No fistula or abscess.   We performed a colonoscopy which showed some ileitis.  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  I reviewed the results with pathology and they did special stains for AFB and it was negative.  They felt the biopsies were most consistent with Crohn's disease and not tuberculosis.  She was seen by infectious disease and started on INH.  She has been on treatment now for a few months.  Now that she is more than 1 month into treatment they have cleared her to start biologic therapy if needed.  While she was being evaluated for the QuantiFERON gold we had placed her on budesonide  to treat the Crohn's.  She states it did help reduce her symptoms but did not eliminate it.  Her main symptoms tend to be intermittent right lower quadrant pain which can come and go.  She has some discomfort with urgency to have a bowel movement but is relieved with a bowel movement.  She has changed her diet trying to eat much more healthy, she thinks that is related to some of her weight loss, she thinks she is lost 10 pounds over the past several months.  She states that INH can also cause nausea and make her lose her appetite.  We discussed how much the symptoms bother her and if she wanted to pursue biologic therapy for her symptoms.  She does have fairly frequent abdominal pain attributed to this, again budesonide  did provide some relief but did not resolve it.  She finished that taper last  week  Prior workup: Colonoscopy 01/20/22 for colon cancer screening. - One diminutive polyp in the sigmoid colon, removed with a cold snare. Resected and retrieved.  - Tortuous colon.  - The examination was otherwise normal. Path:  Diagnosis Surgical [P], colon, sigmoid, polyp (1) - HYPERPLASTIC POLYP (S).   10/01/23 CT ABD/pelvis w/ contrast IMPRESSION:  1. Mural thickening and mucosal hyperenhancement in the distal ileum  and terminal ileum with multiple segmental regions of luminal  narrowing. Findings are concerning for inflammatory bowel disease  such as Crohn's disease.  2. There is dilatation of small-bowel loops throughout the distal  jejunum and ileum with multiple air-fluid levels, as well as near  complete decompression of the colon, concerning for a developing  small-bowel obstruction.  3. Additional incidental findings, as above.     MRE 11/08/23: 1. Short segment wall thickening and mucosal hyperenhancement of the  terminal ileum, affecting a segment approximately 7 cm in length  from the ileocecal valve. 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  2. Previously seen distal small bowel obstruction is resolved as are  additional thickened and hyperenhancing loops of distal ileum seen  in the right lower quadrant on prior examination.  3. No evidence of overt stricture, fistula, or abscess.  4. Trace free fluid in the pelvis, likely reactive.        Colonoscopy 12/21/23: - The perianal and digital rectal examinations were  normal. - Diffuse moderate inflammation characterized by congestion (edema), erosions, erythema, friability and granularity was found in the terminal ileum. Biopsies were taken with a cold forceps for histology and mycobacterial staining. - A 5 mm polyp was found in the descending colon. The polyp was flat. The polyp was removed with a cold snare. Resection and retrieval were complete. - Localized ? mild inflammation characterized by altered  vascularity and granularity was found in the very distal rectum. Biopsies were taken with a cold forceps for histology. - The colon was extremely tortuous. - Internal hemorrhoids were found during retroflexion. The hemorrhoids were small. - The exam was otherwise without abnormality.  FINAL DIAGNOSIS       1. Surgical [P], small bowel, ileum :      - MILDLY ACTIVE CHRONIC ILEITIS, CONSISTENT WITH PATIENT'S CLINICAL HISTORY OF      CROHN'S DISEASE      - NEGATIVE FOR GRANULOMAS OR DYSPLASIA       2. Surgical [P], small bowel, ileum :      - MILDLY ACTIVE CHRONIC ILEITIS, CONSISTENT WITH PATIENT'S CLINICAL HISTORY OF      CROHN'S DISEASE      - NEGATIVE FOR GRANULOMAS OR DYSPLASIA       3. Surgical [P], colon, descending, polyp (1) :      - POLYPOID COLONIC MUCOSA WITH MILD HYPERPLASTIC CHANGES      - NEGATIVE FOR DYSPLASIA      - MULTIPLE STEP SECTIONS WERE EXAMINED       4. Surgical [P], colon, rectum bxs :      - RECTAL MUCOSA WITH NO SPECIFIC HISTOPATHOLOGIC CHANGES      - NEGATIVE FOR ACUTE INFLAMMATION, FEATURES OF CHRONICITY, GRANULOMAS OR      DYSPLASIA   ADDENDUM : AFB stains are negative for acid fast bacilli.    Past Medical History:  Diagnosis Date   Crohn's disease (HCC)    TB lung, latent      Past Surgical History:  Procedure Laterality Date   CESAREAN SECTION N/A 1997 and 2008   Phreesia 03/04/2020   TONSILLECTOMY  1985   Family History  Problem Relation Age of Onset   Varicose Veins Mother    Colon polyps Father 15   Stroke Father    Hypertension Father    Colon cancer Neg Hx    Esophageal cancer Neg Hx    Prostate cancer Neg Hx    Rectal cancer Neg Hx    Stomach cancer Neg Hx    Pancreatic cancer Neg Hx    Social History   Tobacco Use   Smoking status: Never   Smokeless tobacco: Never  Vaping Use   Vaping status: Never Used  Substance Use Topics   Alcohol use: Not Currently    Alcohol/week: 0.0 - 2.0 standard drinks of alcohol    Comment:  socially beer and wine   Drug use: Never   Current Outpatient Medications  Medication Sig Dispense Refill   [START ON 04/06/2024] isoniazid  (NYDRAZID ) 300 MG tablet Take 1 tablet (300 mg total) by mouth daily. 30 tablet 1   Probiotic Product (PROBIOTIC DAILY PO) Take 1 capsule by mouth daily. Instructions are take 1 capsule for 4 days and then increase to 2 capsules     pyridOXINE  (B-6) 50 MG tablet Take 1 tablet (50 mg total) by mouth daily. 30 tablet 8   No current facility-administered medications for this visit.   No Known Allergies   Review of Systems: All  systems reviewed and negative except where noted in HPI.    Lab Results  Component Value Date   NA 134 (L) 03/07/2024   CL 97 (L) 03/07/2024   K 3.9 03/07/2024   CO2 27 03/07/2024   BUN 16 03/07/2024   CREATININE 0.97 03/07/2024   EGFR 70 03/07/2024   CALCIUM 9.8 03/07/2024   ALBUMIN 4.3 09/16/2022   GLUCOSE 70 03/07/2024    Lab Results  Component Value Date   ALT 20 02/17/2024   AST 22 02/17/2024   ALKPHOS 85 09/16/2022   BILITOT 1.3 (H) 02/17/2024     Physical Exam: BP 108/74   Pulse 60   Ht 5' 5 (1.651 m)   Wt 137 lb 4 oz (62.3 kg)   SpO2 99%   BMI 22.84 kg/m  Constitutional: Pleasant,well-developed, female in no acute distress. Neurological: Alert and oriented to person place and time. Psychiatric: Normal mood and affect. Behavior is normal.   ASSESSMENT: 53 y.o. female here for assessment of the following  1. Crohn's disease of small intestine with other complication (HCC)   2. TB lung, latent    As above, ongoing abdominal pain attributed to what is likely Crohn's disease of her ileum given workup to date.  Her workup did reveal a positive QuantiFERON gold but thought to have latent tuberculosis and not causing her ileitis.  Stains for AFB were negative, discussed pathology results at length with the pathologist who thinks this is most likely Crohn's disease.  We gave her a trial of budesonide   which did improve her symptoms albeit not resolved them.  We discussed Crohn's disease in general, risks for worsening without therapy to include obstruction, abscess, fistula, etc. with continued pain.  On the spectrum of Crohn disease she has mild ileal disease, has never required an operation or hospitalization but she does have some persistent mild symptoms from this.  Unclear if this is due to ongoing inflammation from this or if she has developed some scar tissue/fibrotic change.  Again with improvement with budesonide  would assume she has active inflammation and we discussed treatment of this moving forward would initially be with biologic therapy.  If she failed to respond to that would need to consider surgical therapy with resection.  We discussed treatment options in general.  ID has cleared her to start biologic therapy if needed.  We discussed a variety of options, risks of each, costs, insurance considerations etc.  I would like to check her for hepatitis B and C testing as well as check a fecal calprotectin for marker of active inflammation prior to pursuing therapy.  We discussed if she wanted to proceed with biologic therapy, of the options reviewed, would consider Skyrizi initially.  She is going to read about this and look at information on the [facility] website.  She did not want to commit to therapy today but wants to think about it more.  The other option would be to monitor off biologic therapy and use budesonide  as needed if she wanted to go that route.  My concern is that given her persistent symptoms over time with imaging findings, expectation would be likely continued symptoms without maintenance therapy, overall I think she would benefit from treatment.  Otherwise reviewed dietary changes, recommend low residual diet to help minimize symptoms.  If she has significantly elevated fecal calprotectin I think she would be more inclined to treat with biologic at this time.  We will await that  result with further recommendations.  In the  interim she will think more about her options and read about treatment given the information provided today.  PLAN: - lab today - fecal calprotectin, hepatitis B surface antigen, hepatitis B core antibody, hepatitis C antibody - information on the [employer], discussed Skyrizi. Cleared to proceed per ID if she wants. Discussed options she wants to think about it - low residual diet - budesonide  PRN  Will circle back with her to discuss options again following fecal calprotectin and after she has had time to read more about Skyrizi, biologic options.  Marcey Naval, MD [facility]

## 2024-03-24 LAB — HEPATITIS B CORE ANTIBODY, TOTAL: Hep B Core Total Ab: NONREACTIVE

## 2024-03-24 LAB — HEPATITIS B SURFACE ANTIGEN: Hepatitis B Surface Ag: NONREACTIVE

## 2024-03-24 LAB — HEPATITIS C ANTIBODY: Hepatitis C Ab: NONREACTIVE

## 2024-03-26 ENCOUNTER — Ambulatory Visit: Payer: Self-pay | Admitting: Gastroenterology

## 2024-03-26 ENCOUNTER — Ambulatory Visit

## 2024-03-26 ENCOUNTER — Other Ambulatory Visit: Payer: Self-pay

## 2024-03-26 DIAGNOSIS — Z227 Latent tuberculosis: Secondary | ICD-10-CM | POA: Diagnosis not present

## 2024-03-26 DIAGNOSIS — K50018 Crohn's disease of small intestine with other complication: Secondary | ICD-10-CM

## 2024-03-28 LAB — CALPROTECTIN, FECAL: Calprotectin, Fecal: 124 ug/g — ABNORMAL HIGH (ref 0–120)

## 2024-03-29 ENCOUNTER — Other Ambulatory Visit: Payer: Self-pay

## 2024-03-29 ENCOUNTER — Ambulatory Visit: Admitting: Infectious Diseases

## 2024-03-29 VITALS — BP 116/75 | HR 97 | Temp 97.5°F | Wt 136.0 lb

## 2024-03-29 DIAGNOSIS — Z5181 Encounter for therapeutic drug level monitoring: Secondary | ICD-10-CM

## 2024-03-29 DIAGNOSIS — T148XXA Other injury of unspecified body region, initial encounter: Secondary | ICD-10-CM | POA: Diagnosis not present

## 2024-03-29 DIAGNOSIS — Z227 Latent tuberculosis: Secondary | ICD-10-CM

## 2024-03-29 NOTE — Progress Notes (Unsigned)
 [name]                                      [address], [place], [name], [address]                                               Phn. [phone]; Fax: [phone]                                                               Date: 03/29/24 Reason for Visit: Latent TB fu   HPI: [place] is a 53 y.o.old female with who is here for latent T fu. Seen by Dr Efrain 3/25 and was started on Isoniazid  and Vit B6 which she reports starting to take same day without missing any doses or any concerns.  She has feeling of some muscle pulling at the back of her neck but likely unrelated.  She had a colonoscopy done on 12/21/2023 with findings as below, with findings s/o crohn's disease, no concerns for malignancy or TB.  Reviewed side effects of isoniazid .  She has no complaints today.   6/6  Reports compliance with Isoniazid  and vit b6, however reports some bruising in her b/l buttocks without trauma. Reports h/o low platelets in the past. She is also taking budesonide  for a prolonged period (3mg  2 tabs starting today) per GI for possible crohn's disease.  She also reports some blurry vision lately despite her glasses and discussed to see Ophthalmologist but she would like to hold off on that. Denies numbness, tingling in the extremities. No other complaints.  7/17 Compliant with Isoniazid  and Vit B 6 daily without missed doses or concerns. Budesonide  stopped on July 2nd, all her bruises have resolved and only one small size in the rt arm. Denies any new GI concerns or numbness or tingling if extremities. Seen by GI on 7/11 and possible plan for skyrizi noted. No other complaints.   ROS: Denies yellowish discoloration of sclera and skin, abdominal pain/distension, hematemesis.            Denies fever, chills, nightsweats, nausea, vomiting, diarrhea, constipation,rashes, joint complaints, shortness of breath, cough, chest pain,  headaches, GU complaints.   Current Outpatient Medications on File Prior to Visit  Medication Sig Dispense Refill   [START ON 04/06/2024] isoniazid  (NYDRAZID ) 300 MG tablet Take 1 tablet (300 mg total) by mouth daily. (Patient not taking: Reported on 03/29/2024) 30 tablet 1   Probiotic Product (PROBIOTIC DAILY PO) Take 1 capsule by mouth daily. Instructions are take 1 capsule for 4 days and then increase to 2 capsules     pyridOXINE  (B-6) 50 MG tablet Take 1 tablet (50 mg total) by mouth daily. (Patient not taking: Reported on 03/29/2024) 30 tablet 8   No current facility-administered medications on file prior to visit.   No Known Allergies  Past Medical History:  Diagnosis Date   Crohn's disease (HCC)    TB lung, latent     Past Surgical History:  Procedure Laterality Date   CESAREAN SECTION N/A  1997 and 2008   Phreesia 03/04/2020   TONSILLECTOMY  1985   Social History   Socioeconomic History   Marital status: Married    Spouse name: Not on file   Number of children: 2   Years of education: Not on file   Highest education level: Bachelor's degree (e.g., BA, AB, BS)  Occupational History   Not on file  Tobacco Use   Smoking status: Never   Smokeless tobacco: Never  Vaping Use   Vaping status: Never Used  Substance and Sexual Activity   Alcohol use: Not Currently    Alcohol/week: 0.0 - 2.0 standard drinks of alcohol    Comment: socially beer and wine   Drug use: Never   Sexual activity: Not on file  Other Topics Concern   Not on file  Social History Narrative   Not on file   Social Drivers of Health   Financial Resource Strain: Low Risk  (10/05/2023)   Overall Financial Resource Strain (CARDIA)    Difficulty of Paying Living Expenses: Not very hard  Food Insecurity: No Food Insecurity (10/05/2023)   Hunger Vital Sign    Worried About Running Out of Food in the Last Year: Never true    Ran Out of Food in the Last Year: Never true  Transportation Needs: No  Transportation Needs (10/05/2023)   PRAPARE - Administrator, Civil Service (Medical): No    Lack of Transportation (Non-Medical): No  Physical Activity: Sufficiently Active (10/05/2023)   Exercise Vital Sign    Days of Exercise per Week: 5 days    Minutes of Exercise per Session: 40 min  Stress: No Stress Concern Present (10/05/2023)   [employer] of Occupational Health - Occupational Stress Questionnaire    Feeling of Stress : Not at all  Social Connections: Moderately Integrated (10/05/2023)   Social Connection and Isolation Panel    Frequency of Communication with Friends and Family: More than three times a week    Frequency of Social Gatherings with Friends and Family: Three times a week    Attends Religious Services: More than 4 times per year    Active Member of Clubs or Organizations: No    Attends Banker Meetings: Not on file    Marital Status: Married  Intimate Partner Violence: Unknown (12/18/2021)   Received from [facility]   HITS    Physically Hurt: Not on file    Insult or Talk Down To: Not on file    Threaten Physical Harm: Not on file    Scream or Curse: Not on file   Family History  Problem Relation Age of Onset   Varicose Veins Mother    Colon polyps Father 71   Stroke Father    Hypertension Father    Colon cancer Neg Hx    Esophageal cancer Neg Hx    Prostate cancer Neg Hx    Rectal cancer Neg Hx    Stomach cancer Neg Hx    Pancreatic cancer Neg Hx    Vitals BP 116/75   Pulse 97   Temp (!) 97.5 F (36.4 C) (Oral)   Wt 136 lb (61.7 kg)   SpO2 99%   BMI 22.63 kg/m   Gen: Alert and oriented x 3, no acute distress HEENT: [place]/AT, no scleral icterus, no pale conjunctivae/icterus, hearing normal, oral mucosa moist Neck: Supple Cardio: Regular rate and rhythm Resp: Normal respiratory effort GI: nondistended,  GU: Musc: Extremities: No pedal edema Skin: No obvious rashes  Neuro: grossly non focal, awake, alert and  oriented  Psych: Calm, cooperative  Laboratory     Component Value Date/Time   WBC 3.0 (L) 03/23/2024 0859   RBC 5.24 (H) 03/23/2024 0859   HGB 14.1 03/23/2024 0859   HCT 43.2 03/23/2024 0859   PLT 125.0 (L) 03/23/2024 0859   MCV 82.4 03/23/2024 0859   MCH 26.9 (L) 03/07/2024 0851   MCHC 32.8 03/23/2024 0859   RDW 14.3 03/23/2024 0859   LYMPHSABS 0.9 03/23/2024 0859   MONOABS 0.2 03/23/2024 0859   EOSABS 0.1 03/23/2024 0859   BASOSABS 0.0 03/23/2024 0859      Latest Ref Rng & Units 03/07/2024    8:50 AM 02/17/2024    8:47 AM 01/06/2024    8:43 AM  CMP  Glucose 65 - 99 mg/dL 70  77  79   BUN 7 - 25 mg/dL 16  20  18    Creatinine 0.50 - 1.03 mg/dL 9.02  8.95  9.25   Sodium 135 - 146 mmol/L 134  137  137   Potassium 3.5 - 5.3 mmol/L 3.9  4.5  4.6   Chloride 98 - 110 mmol/L 97  101  102   CO2 20 - 32 mmol/L 27  27  27    Calcium 8.6 - 10.4 mg/dL 9.8  89.8  9.6   Total Protein 6.1 - 8.1 g/dL  7.7  7.2   Total Bilirubin 0.2 - 1.2 mg/dL  1.3  0.8   AST 10 - 35 U/L  22  24   ALT 6 - 29 U/L  20  18    Pathology  FINAL DIAGNOSIS       1. Surgical [P], small bowel, ileum :      - MILDLY ACTIVE CHRONIC ILEITIS, CONSISTENT WITH PATIENT'S CLINICAL HISTORY OF      CROHN'S DISEASE      - NEGATIVE FOR GRANULOMAS OR DYSPLASIA       2. Surgical [P], small bowel, ileum :      - MILDLY ACTIVE CHRONIC ILEITIS, CONSISTENT WITH PATIENT'S CLINICAL HISTORY OF      CROHN'S DISEASE      - NEGATIVE FOR GRANULOMAS OR DYSPLASIA       3. Surgical [P], colon, descending, polyp (1) :      - POLYPOID COLONIC MUCOSA WITH MILD HYPERPLASTIC CHANGES      - NEGATIVE FOR DYSPLASIA      - MULTIPLE STEP SECTIONS WERE EXAMINED       4. Surgical [P], colon, rectum bxs :      - RECTAL MUCOSA WITH NO SPECIFIC HISTOPATHOLOGIC CHANGES      - NEGATIVE FOR ACUTE INFLAMMATION, FEATURES OF CHRONICITY, GRANULOMAS OR      DYSPLASIA  MICROSCOPIC DESCRIPTION  CASE COMMENTS STAINS USED IN  DIAGNOSIS: H&E H&E H&E *RECUT DEEPER X 9 LEVELS *RECUT DEEPER X 9 LEVELS *RECUT DEEPER X 9 LEVELS H&E Stains used in diagnosis 1 *Acid Fast Bacillus Stain, 2 *Acid Fast Bacillus Stain  ADDENDUM and B.   AFB stains are negative for acid fast bacilli. Rebbecca Rolla Crawford, Pathologist, Electronic Signature [phone])   Pertinent Imaging 11/28/23 Chest Xray  FINDINGS: The heart size and mediastinal contours are within normal limits. Both lungs are clear. The visualized skeletal structures are unremarkable.   IMPRESSION: No active cardiopulmonary disease.  Assessment/Plan: # Latent TB  - HIV NR - chest xray with no acute abnormality   Plan - continue Isoniazid  and pyridoxine  as  is  - Reviewed side effects and no concerns  - labs today - Plan for 6 months through 9/25 - Fu in 2 months, end of tx  # Bruises  - resolved mostly, only 1 remaining. Budesonide  stopped   # Possible crohns diseas - Fu with GI, possible plans for skyrizi  I spent 22 minutes involved in face-to-face and non-face-to-face activities for this patient on the day of the visit. Professional time spent includes the following activities: Preparing to see the patient (review of tests), reviewing notes from GI, Performing a medically appropriate examination and evaluation , Ordering labs,  reviewing s/e of medications, Documenting clinical information in the EMR, Independently interpreting results (not separately reported), Communicating results to the patient, Counseling and educating the patient. Patients questions were addressed and answered.   Of note, portions of this note may have been created with voice recognition software. While this note has been edited for accuracy, occasional wrong-word or 'sound-a-like' substitutions may have occurred due to the inherent limitations of voice recognition software.   Electronically signed by:  Annalee Orem, MD Infectious Diseases  Office phone  [phone] Fax no. [phone]

## 2024-03-30 ENCOUNTER — Ambulatory Visit: Payer: Self-pay | Admitting: Infectious Diseases

## 2024-03-30 LAB — HEPATIC FUNCTION PANEL
AG Ratio: 1.7 (calc) (ref 1.0–2.5)
ALT: 20 U/L (ref 6–29)
AST: 25 U/L (ref 10–35)
Albumin: 4.3 g/dL (ref 3.6–5.1)
Alkaline phosphatase (APISO): 69 U/L (ref 37–153)
Bilirubin, Direct: 0.2 mg/dL (ref 0.0–0.2)
Globulin: 2.6 g/dL (ref 1.9–3.7)
Indirect Bilirubin: 1.1 mg/dL (ref 0.2–1.2)
Total Bilirubin: 1.3 mg/dL — ABNORMAL HIGH (ref 0.2–1.2)
Total Protein: 6.9 g/dL (ref 6.1–8.1)

## 2024-03-31 ENCOUNTER — Other Ambulatory Visit: Payer: Self-pay | Admitting: Gastroenterology

## 2024-03-31 DIAGNOSIS — K5000 Crohn's disease of small intestine without complications: Secondary | ICD-10-CM

## 2024-03-31 DIAGNOSIS — K5090 Crohn's disease, unspecified, without complications: Secondary | ICD-10-CM | POA: Insufficient documentation

## 2024-04-02 ENCOUNTER — Telehealth (HOSPITAL_COMMUNITY): Payer: Self-pay | Admitting: Pharmacy Technician

## 2024-04-02 NOTE — Telephone Encounter (Addendum)
 Auth Submission: APPROVED Site of care: Site of care: [facility] Payer: [employer] Medication & CPT/J Code(s) submitted: Skyrizi Viann) [id number] Diagnosis Code: K50.90 Route of submission (phone, fax, portal): fax Phone # Fax # Auth type: Buy/Bill PB Units/visits requested: 600mg  x 3 doses (week 0,4,8)  Reference number: [id number] Approval from: 03/22/24 to 07/02/24  Approval letter scanned into Media tab.    Neev Mcmains, CPhT [facility] Phone: [phone] 04/02/2024

## 2024-04-05 ENCOUNTER — Other Ambulatory Visit (HOSPITAL_COMMUNITY): Payer: Self-pay

## 2024-04-05 ENCOUNTER — Encounter: Payer: Self-pay | Admitting: Gastroenterology

## 2024-04-05 NOTE — Telephone Encounter (Signed)
 Skyrizi would be 180mg  every 8 weeks, to start after she completes the infusions (week 12 after starting infusions)

## 2024-04-06 ENCOUNTER — Other Ambulatory Visit (HOSPITAL_COMMUNITY): Payer: Self-pay

## 2024-04-06 ENCOUNTER — Telehealth: Payer: Self-pay

## 2024-04-06 NOTE — Telephone Encounter (Signed)
 Pharmacy Patient Advocate Encounter  Original request was cancelled. Resubmitted [id number]

## 2024-04-06 NOTE — Telephone Encounter (Signed)
 Pharmacy Patient Advocate Encounter   Received notification from Physician's Office that prior authorization for Skyrizi 180MG /1.2ML (150MG /ML) single-dose prefilled cartridge with on-body injector is required/requested.   Insurance verification completed.   The patient is insured through [employer] .   Per test claim: PA required; PA submitted to above mentioned insurance via CoverMyMeds Key/confirmation #/EOC [id number] Status is pending

## 2024-04-07 ENCOUNTER — Other Ambulatory Visit (HOSPITAL_COMMUNITY): Payer: Self-pay

## 2024-04-09 NOTE — Telephone Encounter (Signed)
 Okay thank you, can someone please schedule her for infusions and then the injection therapy if she wants to proceed?  Thanks

## 2024-04-09 NOTE — Telephone Encounter (Signed)
 Pharmacy Patient Advocate Encounter  Received notification from [facility] that Prior Authorization for Skyrizi  180MG /1.2ML (150MG /ML) single-dose prefilled cartridge with on-body injector has been APPROVED from 04-06-2024 to 04-06-2025   PA #/Case ID/Reference #: [id number]   This PA covers the 360mg  + 180mg  + 150mg 

## 2024-04-09 NOTE — Telephone Encounter (Signed)
 See note from Dr Leigh

## 2024-04-10 NOTE — Telephone Encounter (Signed)
 Dr. Leigh Remonia, Patient will be scheduled as soon as possible. Induction and Maintenance dosing has been approved.  @Judy  McAdams, we can now contact [employer] to enroll patient for the co-pay card.  Thanks Luke

## 2024-04-12 ENCOUNTER — Other Ambulatory Visit (HOSPITAL_COMMUNITY): Payer: Self-pay

## 2024-04-12 MED ORDER — SKYRIZI 180 MG/1.2ML ~~LOC~~ SOCT: 180.0000 mg | SUBCUTANEOUS | 6 refills | Status: AC

## 2024-04-12 NOTE — Telephone Encounter (Signed)
 Please note when sending in OBI script - patient insurance will not allow for fill at [facility]. Does not specify which specialty pharmacy to send to.

## 2024-04-12 NOTE — Telephone Encounter (Signed)
 Thanks ladies. Rock-  fyi her 1st infusion will be 05/01/24  Luke

## 2024-04-12 NOTE — Telephone Encounter (Signed)
 FYI injection has been sent into pharmacy.

## 2024-04-12 NOTE — Telephone Encounter (Signed)
 thanks

## 2024-04-12 NOTE — Telephone Encounter (Signed)
@  Rosina, You will need to reach out to the insurance and verify which pharmacy her plan requires her to use.  Once verified [facility] will be able to send the script.  Thanks Luke

## 2024-04-12 NOTE — Addendum Note (Signed)
 Addended by: PERLEY HANDING R on: 04/12/2024 11:17 AM   Modules accepted: Orders

## 2024-04-12 NOTE — Telephone Encounter (Signed)
 Script sent to pharmacy.

## 2024-04-12 NOTE — Telephone Encounter (Signed)
 Luke do you happen to know what specialty pharmacy we would need to send the skyrizi  injection to? See note below from [place].

## 2024-04-16 ENCOUNTER — Telehealth: Payer: Self-pay | Admitting: Gastroenterology

## 2024-04-16 NOTE — Telephone Encounter (Signed)
 Spoke with pharmacy and let them know she starts the IV skyrizi  on 04/19/24. Pharmacy will touch base with pt and let her know they will hold off on shipping the OBI closer to the date she should start the injection.

## 2024-04-16 NOTE — Telephone Encounter (Signed)
 Inbound call from [facility] requesting to know if patient has had loading doses for Skyrizi . Call back number is [phone]. Please advise, thank you

## 2024-04-19 ENCOUNTER — Ambulatory Visit: Admitting: *Deleted

## 2024-04-19 VITALS — BP 118/74 | HR 62 | Temp 97.8°F | Resp 16 | Ht 65.0 in | Wt 137.8 lb

## 2024-04-19 DIAGNOSIS — K5000 Crohn's disease of small intestine without complications: Secondary | ICD-10-CM | POA: Diagnosis not present

## 2024-04-19 MED ORDER — SODIUM CHLORIDE 0.9 % IV SOLN
600.0000 mg | Freq: Once | INTRAVENOUS | Status: AC
  Administered 2024-04-19: 600 mg via INTRAVENOUS
  Filled 2024-04-19: qty 10

## 2024-04-19 NOTE — Progress Notes (Signed)
 Diagnosis: Crohn's Disease  Provider:  Mannam, Praveen MD  Procedure: IV Infusion  IV Type: Peripheral, IV Location: R Forearm  Skyrizi  (risankizumab -rzaa), Dose: 600 mg  Infusion Start Time: 0926 am  Infusion Stop Time: 1030 am  Post Infusion IV Care: Observation period completed and Peripheral IV Discontinued  Discharge: Condition: Good, Destination: Home . AVS Provided  Performed by:  Trudy Lamarr LABOR, RN

## 2024-04-19 NOTE — Patient Instructions (Signed)
 Risankizumab Injection What is this medication? RISANKIZUMAB (RIS an KIZ ue mab) treats autoimmune conditions, such as psoriasis, arthritis, Crohn disease, and ulcerative colitis. It works by slowing down an overactive immune system.  It is a monoclonal antibody. This medicine may be used for other purposes; ask your health care provider or pharmacist if you have questions. COMMON BRAND NAME(S): Skyrizi What should I tell my care team before I take this medication? They need to know if you have any of these conditions: Hepatic disease Immune system problems Infection, such as tuberculosis (TB), bacterial, fungal or viral infections Recent or upcoming vaccine An unusual or allergic reaction to risankizumab, other medications, foods, dyes, or preservatives Pregnant or trying to get pregnant Breast-feeding How should I use this medication? This medication is injected into a vein or under the skin. It is given by your care team in a hospital or clinic setting. It may also be given at home. If you get this medication at home, you will be taught how to prepare and give it. Use exactly as directed. Take it as directed on the prescription label. Keep taking it unless your care team tells you to stop. If you use a pen, be sure to take off the outer needle cover before using the dose. It is important that you put your used needles and syringes in a special sharps container. Do not put them in a trash can. If you do not have a sharps container, call your pharmacist or care team to get one. A special MedGuide will be given to you by the pharmacist with each prescription and refill. Be sure to read this information carefully each time. This medication comes with INSTRUCTIONS FOR USE. Ask your pharmacist for directions on how to use this medication. Read the information carefully. Talk to your pharmacist or care team if you have questions. Talk to your care team about the use of this medication in children.  Special care may be needed. Overdosage: If you think you have taken too much of this medicine contact a poison control center or emergency room at once. NOTE: This medicine is only for you. Do not share this medicine with others. What if I miss a dose? It is important not to miss any doses. Talk to your care team about what to do if you miss a dose. What may interact with this medication? Do not take this medication with any of the following: Live vaccines This list may not describe all possible interactions. Give your health care provider a list of all the medicines, herbs, non-prescription drugs, or dietary supplements you use. Also tell them if you smoke, drink alcohol, or use illegal drugs. Some items may interact with your medicine. What should I watch for while using this medication? Visit your care team for regular checks on your progress. Tell your care team if your symptoms do not start to get better or if they get worse. You will be tested for tuberculosis (TB) before you start this medication. If your care team prescribes any medication for TB, you should start taking the TB medication before starting this medication. Make sure to finish the full course of TB medication. This medication may increase your risk of getting an infection. Call your care team for advice if you get a fever, chills, sore throat, or other symptoms of a cold or flu. Do not treat yourself. Try to avoid being around people who are sick. This medication can decrease the response to a vaccine. If you  need to get vaccinated, tell your care team if you have received this medication. Extra booster doses may be needed. Talk to your care team to see if a different vaccination schedule is needed. What side effects may I notice from receiving this medication? Side effects that you should report to your care team as soon as possible: Allergic reactions--skin rash, itching, hives, swelling of the face, lips, tongue, or  throat Infection--fever, chills, cough, sore throat, wounds that don't heal, pain or trouble when passing urine, general feeling of discomfort or being unwell Liver injury--right upper belly pain, loss of appetite, nausea, light-colored stool, dark yellow or brown urine, yellowing skin or eyes, unusual weakness or fatigue Side effects that usually do not require medical attention (report to your care team if they continue or are bothersome): Fatigue Headache Pain, redness, or irritation at injection site Runny or stuffy nose Sore throat This list may not describe all possible side effects. Call your doctor for medical advice about side effects. You may report side effects to FDA at 1-800-FDA-1088. Where should I keep my medication? Keep out of the reach of children and pets. Store in a refrigerator. Do not freeze. Protect from light. Keep it in the original carton until you are ready to take it. See product for storage information. Each product may have different instructions. Remove the dose from the carton about 30 to 45 minutes before it is time for you to take it. Get rid of any unused medication after the expiration date. To get rid of medications that are no longer needed or have expired: Take the medication to a medication take-back program. Check with your pharmacy or law enforcement to find a location. If you cannot return the medication, ask your pharmacist or care team how to get rid of this medication safely. NOTE: This sheet is a summary. It may not cover all possible information. If you have questions about this medicine, talk to your doctor, pharmacist, or health care provider.  2024 Elsevier/Gold Standard (2023-08-12 00:00:00)

## 2024-04-24 ENCOUNTER — Other Ambulatory Visit: Payer: Self-pay | Admitting: Infectious Diseases

## 2024-04-24 NOTE — Telephone Encounter (Signed)
 End of treatment is 9/25, patient does not need 90 day supply.  Natalie Mceuen, BSN, RN

## 2024-05-17 ENCOUNTER — Ambulatory Visit

## 2024-05-17 VITALS — BP 120/77 | HR 59 | Temp 97.6°F | Resp 16 | Ht 65.0 in | Wt 135.2 lb

## 2024-05-17 DIAGNOSIS — K5000 Crohn's disease of small intestine without complications: Secondary | ICD-10-CM

## 2024-05-17 MED ORDER — SODIUM CHLORIDE 0.9 % IV SOLN
600.0000 mg | Freq: Once | INTRAVENOUS | Status: AC
  Administered 2024-05-17: 600 mg via INTRAVENOUS
  Filled 2024-05-17: qty 10

## 2024-05-17 NOTE — Progress Notes (Signed)
 Diagnosis: Crohn's Disease  Provider:  Praveen Mannam MD  Procedure: IV Infusion  IV Type: Peripheral, IV Location: L Antecubital  Skyrizi  (risankizumab -rzaa), Dose: 600 mg  Infusion Start Time: 0909  Infusion Stop Time: 1012  Post Infusion IV Care: Peripheral IV Discontinued  Discharge: Condition: Good, Destination: Home . AVS Declined  Performed by:  Leita FORBES Miles, LPN

## 2024-05-26 ENCOUNTER — Other Ambulatory Visit: Payer: Self-pay | Admitting: Infectious Diseases

## 2024-05-28 NOTE — Telephone Encounter (Signed)
 Treatment should be ending 9/25 per provider note.   Harlan Vinal, BSN, RN

## 2024-05-31 ENCOUNTER — Ambulatory Visit: Admitting: Infectious Diseases

## 2024-05-31 ENCOUNTER — Other Ambulatory Visit: Payer: Self-pay

## 2024-05-31 VITALS — BP 118/80 | HR 67 | Temp 98.0°F | Wt 134.0 lb

## 2024-05-31 DIAGNOSIS — Z227 Latent tuberculosis: Secondary | ICD-10-CM | POA: Diagnosis not present

## 2024-05-31 DIAGNOSIS — Z5181 Encounter for therapeutic drug level monitoring: Secondary | ICD-10-CM | POA: Diagnosis not present

## 2024-05-31 LAB — COMPREHENSIVE METABOLIC PANEL WITH GFR
AG Ratio: 1.7 (calc) (ref 1.0–2.5)
ALT: 19 U/L (ref 6–29)
AST: 28 U/L (ref 10–35)
Albumin: 4.7 g/dL (ref 3.6–5.1)
Alkaline phosphatase (APISO): 78 U/L (ref 37–153)
BUN: 23 mg/dL (ref 7–25)
CO2: 30 mmol/L (ref 20–32)
Calcium: 10.1 mg/dL (ref 8.6–10.4)
Chloride: 102 mmol/L (ref 98–110)
Creat: 0.81 mg/dL (ref 0.50–1.03)
Globulin: 2.8 g/dL (ref 1.9–3.7)
Glucose, Bld: 72 mg/dL (ref 65–99)
Potassium: 4.4 mmol/L (ref 3.5–5.3)
Sodium: 139 mmol/L (ref 135–146)
Total Bilirubin: 1.4 mg/dL — ABNORMAL HIGH (ref 0.2–1.2)
Total Protein: 7.5 g/dL (ref 6.1–8.1)
eGFR: 87 mL/min/1.73m2 (ref >=60)

## 2024-05-31 NOTE — Progress Notes (Unsigned)
 [facility]                                      [address], [place], [name], [address]                                               Phn. [phone]; Fax: [phone]                                                               Date: 05/31/24 Reason for Visit: Latent TB fu   HPI: Mackenzie Floyd is a 53 y.o.old female with who is here for latent T fu. Seen by Dr Efrain 3/25 and was started on Isoniazid  and Vit B6 which she reports starting to take same day without missing any doses or any concerns.  She has feeling of some muscle pulling at the back of her neck but likely unrelated.  She had a colonoscopy done on 12/21/2023 with findings as below, with findings s/o crohn's disease, no concerns for malignancy or TB.  Reviewed side effects of isoniazid .  She has no complaints today.   6/6  Reports compliance with Isoniazid  and vit b6, however reports some bruising in her b/l buttocks without trauma. Reports h/o low platelets in the past. She is also taking budesonide  for a prolonged period (3mg  2 tabs starting today) per GI for possible crohn's disease.  She also reports some blurry vision lately despite her glasses and discussed to see Ophthalmologist but she would like to hold off on that. Denies numbness, tingling in the extremities. No other complaints.  7/17 Compliant with Isoniazid  and Vit B 6 daily without missed doses or concerns. Budesonide  stopped on July 2nd, all her bruises have resolved and only one small size in the rt arm. Denies any new GI concerns or numbness or tingling if extremities. Seen by GI on 7/11 and possible plan for skyrizi  noted. No other complaints.   9/18 Completed 2 doses of skyrizi  and 3rd dose is pending. Compliant with Isoniazid  and Vit b6. No concerns, She will send my chart message after completion of course on 9/25 for letter of completion of latent TB tx. No other concerns.   ROS: Denies  yellowish discoloration of sclera and skin, abdominal pain/distension, hematemesis.            Denies fever, chills, nightsweats, nausea, vomiting, diarrhea, constipation,rashes, joint complaints, shortness of breath, cough, chest pain, headaches, GU complaints.   Current Outpatient Medications on File Prior to Visit  Medication Sig Dispense Refill   isoniazid  (NYDRAZID ) 300 MG tablet Take 1 tablet (300 mg total) by mouth daily. (Patient not taking: Reported on 03/29/2024) 30 tablet 1   Probiotic Product (PROBIOTIC DAILY PO) Take 1 capsule by mouth daily. Instructions are take 1 capsule for 4 days and then increase to 2 capsules     pyridOXINE  (B-6) 50 MG tablet Take 1 tablet (50 mg total) by mouth daily. (Patient not taking: Reported on 03/29/2024) 30 tablet 8   Risankizumab -rzaa (SKYRIZI ) 180 MG/1.2ML SOCT Inject 180  mg into the skin every 8 (eight) weeks. 1.2 mL 6   No current facility-administered medications on file prior to visit.   No Known Allergies  Past Medical History:  Diagnosis Date   Crohn's disease (HCC)    TB lung, latent     Past Surgical History:  Procedure Laterality Date   CESAREAN SECTION N/A 1997 and 2008   Phreesia 03/04/2020   TONSILLECTOMY  1985   Social History   Socioeconomic History   Marital status: Married    Spouse name: Not on file   Number of children: 2   Years of education: Not on file   Highest education level: Bachelor's degree (e.g., BA, AB, BS)  Occupational History   Not on file  Tobacco Use   Smoking status: Never   Smokeless tobacco: Never  Vaping Use   Vaping status: Never Used  Substance and Sexual Activity   Alcohol use: Not Currently    Alcohol/week: 0.0 - 2.0 standard drinks of alcohol    Comment: socially beer and wine   Drug use: Never   Sexual activity: Not on file  Other Topics Concern   Not on file  Social History Narrative   Not on file   Social Drivers of Health   Financial Resource Strain: Low Risk  (10/05/2023)    Overall Financial Resource Strain (CARDIA)    Difficulty of Paying Living Expenses: Not very hard  Food Insecurity: No Food Insecurity (10/05/2023)   Hunger Vital Sign    Worried About Running Out of Food in the Last Year: Never true    Ran Out of Food in the Last Year: Never true  Transportation Needs: No Transportation Needs (10/05/2023)   PRAPARE - Administrator, Civil Service (Medical): No    Lack of Transportation (Non-Medical): No  Physical Activity: Sufficiently Active (10/05/2023)   Exercise Vital Sign    Days of Exercise per Week: 5 days    Minutes of Exercise per Session: 40 min  Stress: No Stress Concern Present (10/05/2023)   [employer] of Occupational Health - Occupational Stress Questionnaire    Feeling of Stress : Not at all  Social Connections: Moderately Integrated (10/05/2023)   Social Connection and Isolation Panel    Frequency of Communication with Friends and Family: More than three times a week    Frequency of Social Gatherings with Friends and Family: Three times a week    Attends Religious Services: More than 4 times per year    Active Member of Clubs or Organizations: No    Attends Banker Meetings: Not on file    Marital Status: Married  Intimate Partner Violence: Unknown (12/18/2021)   Received from [facility]   HITS    Physically Hurt: Not on file    Insult or Talk Down To: Not on file    Threaten Physical Harm: Not on file    Scream or Curse: Not on file   Family History  Problem Relation Age of Onset   Varicose Veins Mother    Colon polyps Father 19   Stroke Father    Hypertension Father    Colon cancer Neg Hx    Esophageal cancer Neg Hx    Prostate cancer Neg Hx    Rectal cancer Neg Hx    Stomach cancer Neg Hx    Pancreatic cancer Neg Hx    Vitals BP 118/80   Pulse 67   Temp 98 F (36.7 C) (Oral)   Wt 134  lb (60.8 kg)   SpO2 99%   BMI 22.30 kg/m    Gen: Alert and oriented x 3, no acute  distress HEENT: [place]/AT, no scleral icterus, no pale conjunctivae/icterus, hearing normal, oral mucosa moist Neck: Supple Cardio: Regular rate and rhythm Resp: Normal respiratory effort GI: nondistended  GU: Musc: Extremities: No pedal edema Skin: No obvious rashes Neuro: grossly non focal, awake, alert and oriented  Psych: Calm, cooperative  Laboratory     Component Value Date/Time   WBC 3.0 (L) 03/23/2024 0859   RBC 5.24 (H) 03/23/2024 0859   HGB 14.1 03/23/2024 0859   HCT 43.2 03/23/2024 0859   PLT 125.0 (L) 03/23/2024 0859   MCV 82.4 03/23/2024 0859   MCH 26.9 (L) 03/07/2024 0851   MCHC 32.8 03/23/2024 0859   RDW 14.3 03/23/2024 0859   LYMPHSABS 0.9 03/23/2024 0859   MONOABS 0.2 03/23/2024 0859   EOSABS 0.1 03/23/2024 0859   BASOSABS 0.0 03/23/2024 0859      Latest Ref Rng & Units 03/29/2024    9:08 AM 03/07/2024    8:50 AM 02/17/2024    8:47 AM  CMP  Glucose 65 - 99 mg/dL  70  77   BUN 7 - 25 mg/dL  16  20   Creatinine 9.49 - 1.03 mg/dL  9.02  8.95   Sodium 864 - 146 mmol/L  134  137   Potassium 3.5 - 5.3 mmol/L  3.9  4.5   Chloride 98 - 110 mmol/L  97  101   CO2 20 - 32 mmol/L  27  27   Calcium 8.6 - 10.4 mg/dL  9.8  89.8   Total Protein 6.1 - 8.1 g/dL 6.9   7.7   Total Bilirubin 0.2 - 1.2 mg/dL 1.3   1.3   AST 10 - 35 U/L 25   22   ALT 6 - 29 U/L 20   20    Pathology  FINAL DIAGNOSIS       1. Surgical [P], small bowel, ileum :      - MILDLY ACTIVE CHRONIC ILEITIS, CONSISTENT WITH PATIENT'S CLINICAL HISTORY OF      CROHN'S DISEASE      - NEGATIVE FOR GRANULOMAS OR DYSPLASIA       2. Surgical [P], small bowel, ileum :      - MILDLY ACTIVE CHRONIC ILEITIS, CONSISTENT WITH PATIENT'S CLINICAL HISTORY OF      CROHN'S DISEASE      - NEGATIVE FOR GRANULOMAS OR DYSPLASIA       3. Surgical [P], colon, descending, polyp (1) :      - POLYPOID COLONIC MUCOSA WITH MILD HYPERPLASTIC CHANGES      - NEGATIVE FOR DYSPLASIA      - MULTIPLE STEP SECTIONS WERE  EXAMINED       4. Surgical [P], colon, rectum bxs :      - RECTAL MUCOSA WITH NO SPECIFIC HISTOPATHOLOGIC CHANGES      - NEGATIVE FOR ACUTE INFLAMMATION, FEATURES OF CHRONICITY, GRANULOMAS OR      DYSPLASIA  MICROSCOPIC DESCRIPTION  CASE COMMENTS STAINS USED IN DIAGNOSIS: H&E H&E H&E *RECUT DEEPER X 9 LEVELS *RECUT DEEPER X 9 LEVELS *RECUT DEEPER X 9 LEVELS H&E Stains used in diagnosis 1 *Acid Fast Bacillus Stain, 2 *Acid Fast Bacillus Stain  ADDENDUM and B.   AFB stains are negative for acid fast bacilli. Rebbecca Come, [place], Sports administrator, International aid/development worker [phone])   Pertinent Imaging 11/28/23 Chest Xray  FINDINGS: The heart size and mediastinal contours are within normal limits. Both lungs are clear. The visualized skeletal structures are unremarkable.   IMPRESSION: No active cardiopulmonary disease.  Assessment/Plan: # Latent TB  - HIV NR - chest xray with no acute abnormality   Plan - continue Isoniazid  and pyridoxine  as is through 9/25 to complete 6 months  - CMP today  - Fu as needed - She will let us  know after tx completed for letter of completion  # Bruise  - resolved   # Possible crohns diseas - Fu with GI, on skyrizi   I spent 21 minutes involved in face-to-face and non-face-to-face activities for this patient on the day of the visit. Professional time spent includes the following activities: Preparing to see the patient (review of tests), Performing a medically appropriate examination and evaluation, Ordering labs, Documenting clinical information in the EMR, Independently interpreting results (not separately reported), Communicating results to the patient, Counseling and educating the patient. Patients questions were addressed and answered.   Of note, portions of this note may have been created with voice recognition software. While this note has been edited for accuracy, occasional wrong-word or 'sound-a-like' substitutions may have  occurred due to the inherent limitations of voice recognition software.   Electronically signed by:  Annalee Orem, MD Infectious Diseases  Office phone [phone] Fax no. [phone]

## 2024-06-04 ENCOUNTER — Encounter: Payer: Self-pay | Admitting: Emergency Medicine

## 2024-06-04 ENCOUNTER — Ambulatory Visit: Payer: Self-pay | Admitting: Infectious Diseases

## 2024-06-04 NOTE — Telephone Encounter (Signed)
 Needs office visit.  Any provider available can see her.  Thanks.

## 2024-06-11 ENCOUNTER — Telehealth: Payer: Self-pay | Admitting: Gastroenterology

## 2024-06-11 NOTE — Telephone Encounter (Signed)
 Patient calls stating that since 05/31/24, she has been experiencing neck pain with sharp electric shocks' going throughout the body along with some muscle pain. She also states that she feels like she has chills and fever. She has not checked her temperature for any objective fever. Patient thinks possibly related to Skyrizi .  Patient began Skyrizi  04/19/24. She states that from a Crohns standpoint, she feels well since beginning the medication.  We discussed that while Skyrizi  does cause immunosuppression and has the possibility to raise the risk for infection (causing chills, fever), the neck pain/electric shock pain is not typical of Skyrizi  as a side effect.   I advised patient to schedule an appointment with her PCP to discuss the recent neck pain/electric shock/muscle symptoms as these could possibly be more neuromuscular in nature. She verbalizes understanding of this information.

## 2024-06-11 NOTE — Telephone Encounter (Signed)
 Patient has made appointment with her PCP for tomorrow, 06/12/24.

## 2024-06-11 NOTE — Telephone Encounter (Signed)
 Agree. Symptoms not typical for Skyrizi . If severe neck pains this sounds more so musculoskeletal and should see her PCP initially. She should take her temp and see if she is having fevers, if she is she needs that evaluated as well. If she is truly having fevers she should let us  know. Thanks

## 2024-06-11 NOTE — Telephone Encounter (Signed)
 PT is calling to discuss her symptoms since being on Skyrizi . She has a tickle in her throat, pain in her muscles, electric shock, fever and chills. Please advise.

## 2024-06-12 ENCOUNTER — Encounter: Payer: Self-pay | Admitting: Emergency Medicine

## 2024-06-12 ENCOUNTER — Ambulatory Visit: Payer: Self-pay | Admitting: Emergency Medicine

## 2024-06-12 ENCOUNTER — Ambulatory Visit: Admitting: Emergency Medicine

## 2024-06-12 VITALS — BP 112/70 | HR 71 | Temp 98.6°F | Ht 65.0 in | Wt 134.0 lb

## 2024-06-12 DIAGNOSIS — K5000 Crohn's disease of small intestine without complications: Secondary | ICD-10-CM

## 2024-06-12 DIAGNOSIS — B349 Viral infection, unspecified: Secondary | ICD-10-CM | POA: Diagnosis not present

## 2024-06-12 LAB — COMPREHENSIVE METABOLIC PANEL WITH GFR
ALT: 17 U/L (ref 0–35)
AST: 25 U/L (ref 0–37)
Albumin: 4.4 g/dL (ref 3.5–5.2)
Alkaline Phosphatase: 73 U/L (ref 39–117)
BUN: 18 mg/dL (ref 6–23)
CO2: 32 meq/L (ref 19–32)
Calcium: 10.1 mg/dL (ref 8.4–10.5)
Chloride: 100 meq/L (ref 96–112)
Creatinine, Ser: 0.8 mg/dL (ref 0.40–1.20)
GFR: 84.29 mL/min (ref >60.00)
Glucose, Bld: 85 mg/dL (ref 70–99)
Potassium: 4.3 meq/L (ref 3.5–5.1)
Sodium: 138 meq/L (ref 135–145)
Total Bilirubin: 1.5 mg/dL — ABNORMAL HIGH (ref 0.2–1.2)
Total Protein: 7.3 g/dL (ref 6.0–8.3)

## 2024-06-12 LAB — CBC WITH DIFFERENTIAL/PLATELET
Basophils Absolute: 0 K/uL (ref 0.0–0.1)
Basophils Relative: 1.2 % (ref 0.0–3.0)
Eosinophils Absolute: 0.2 K/uL (ref 0.0–0.7)
Eosinophils Relative: 6.4 % — ABNORMAL HIGH (ref 0.0–5.0)
HCT: 41.5 % (ref 36.0–46.0)
Hemoglobin: 13.8 g/dL (ref 12.0–15.0)
Lymphocytes Relative: 32.6 % (ref 12.0–46.0)
Lymphs Abs: 1.1 K/uL (ref 0.7–4.0)
MCHC: 33.3 g/dL (ref 30.0–36.0)
MCV: 82.7 fl (ref 78.0–100.0)
Monocytes Absolute: 0.3 K/uL (ref 0.1–1.0)
Monocytes Relative: 8 % (ref 3.0–12.0)
Neutro Abs: 1.7 K/uL (ref 1.4–7.7)
Neutrophils Relative %: 51.8 % (ref 43.0–77.0)
Platelets: 116 K/uL — ABNORMAL LOW (ref 150.0–400.0)
RBC: 5.02 Mil/uL (ref 3.87–5.11)
RDW: 14.1 % (ref 11.5–15.5)
WBC: 3.3 K/uL — ABNORMAL LOW (ref 4.0–10.5)

## 2024-06-12 NOTE — Assessment & Plan Note (Signed)
 Stable chronic condition Recently started on Skyrizi 

## 2024-06-12 NOTE — Patient Instructions (Signed)
 Enfermedades virales en los adultos Viral Illness, Adult [place] virus son microbios diminutos que entran en el organismo de Neomia Dear persona y causan enfermedades. Hay muchos tipos diferentes de virus. Y causan muchos tipos de enfermedades. Las enfermedades virales pueden ser leves o graves. Pueden afectar diferentes partes del cuerpo. Entre las afecciones a corto plazo causadas por virus, se incluyen los resfros, la gripe y los virus Runner, broadcasting/film/video. Entre las afecciones a largo plazo causadas por un virus, se incluyen el herpes, la culebrilla y la infeccin por el virus de inmunodeficiencia humana (VIH). Se han identificado unos pocos virus asociados con determinados tipos de cncer. Cules son las causas? Muchos tipos de virus pueden causar enfermedades. Los virus ingresan en las clulas del [place], se multiplican y provocan que las clulas infectadas funcionen de manera diferente o [place]. Cuando estas clulas mueren, liberan ms virus. Cuando esto ocurre, aparecen los sntomas de la enfermedad, y el virus se disemina a otras clulas. Si el virus domina el funcionamiento de la clula, puede hacer que esta se divida y prolifere de Psychologist, occupational. Esto ocurre cuando un virus causa cncer. Los diferentes virus ingresan al organismo de [employer]. Puede contraer un virus de la siguiente [place]: Al ingerir alimentos o beber agua que han entrado en contacto con el virus. Al inhalar gotitas que una persona infectada liber en el aire al toser o estornudar. Al tocar una superficie que tenga el virus y [facility] mano a la boca, la nariz o los ojos. Al ser picado por un insecto o mordido por un animal que son portadores del virus. Al tener contacto sexual con Neomia Dear persona infectada por el virus. Al tener contacto con sangre o lquidos que contienen el virus, ya sea a travs de un corte abierto o durante una transfusin. Si el virus ingresa al organismo, el sistema del cuerpo que combate las  enfermedades (sistema inmunitario) Product/process development scientist. Puede correr un riesgo ms alto de tener una enfermedad viral si tiene el sistema inmunitario debilitado. Cules son los signos o sntomas? Los sntomas dependen del tipo de virus y de la ubicacin de las clulas en las que ingresa. Entre los sntomas se pueden incluir los siguientes: Con los virus del resfro y de la gripe: Grant Ruts. Dolor de [place]. Dolor de Advertising copywriter. Dolores musculares. Nariz tapada (congestin nasal). Tos. Con los virus estomacales (gastrointestinales): Grant Ruts. Dolor en el abdomen. Nuseas o vmitos. Diarrea. Con los virus hepticos (hepatitis): Prdida del apetito. Cansancio. La piel o las partes blancas de los ojos se ponen amarillas (ictericia). Con los virus del cerebro y la mdula espinal: Grant Ruts. Dolor de [place]. Rigidez en el cuello. Nuseas y vmitos. Confusin o somnolencia. Con los virus de la piel: Public affairs consultant. Picazn. Erupcin cutnea. Con los virus de transmisin sexual: Midwife. Hinchazn. Enrojecimiento. Erupcin cutnea. Cmo se diagnostica? Esta afeccin se puede diagnosticar en funcin de una o ms de las siguientes evaluaciones: Los sntomas y antecedentes mdicos. Un examen fsico. Pruebas, como, por ejemplo: Anlisis de [place]. Anlisis de [place] de mucosidad de los pulmones (muestra de esputo). Anlisis de [place] de materia fecal (heces). Anlisis de un hisopado de lquidos corporales o una llaga en la piel (lesin). Cmo se trata? Los virus pueden ser difciles de tratar porque se hospedan en las clulas. Los antibiticos no tratan los virus porque estos medicamentos no llegan al interior de las clulas. El tratamiento de una enfermedad viral puede incluir lo siguiente: Lawyer y beber mucho lquido. Medicamentos para tratar los sntomas. Estos pueden  incluir medicamentos de venta libre para Chief Technology Officer y la [place], medicamentos para la tos o la congestin y  medicamentos para la diarrea. Medicamentos antivirales. Estos medicamentos estn disponibles nicamente para ciertos tipos de virus. Algunas enfermedades virales pueden evitarse con vacunas. Un ejemplo frecuente es la vacuna antigripal. Siga estas indicaciones en su casa: Medicamentos Use los medicamentos de venta libre y los recetados solamente como se lo haya indicado el mdico. Si le recetaron un medicamento antiviral, tmelo como se lo haya indicado el mdico. No deje de tomar el antiviral aunque comience a sentirse mejor. Sepa cundo los antibiticos son necesarios y cundo no. Los antibiticos no combaten a los virus. Si tiene una infeccin viral y el mdico cree que tambin tiene una infeccin [place], o que est en riesgo de [place], tal vez le recete un antibitico. No solicite una receta para antibiticos si le han diagnosticado una enfermedad viral. Los antibiticos no harn que se cure ms rpidamente. Tomar antibiticos cuando no son necesarios puede derivar en resistencia a los antibiticos. Cuando esto ocurre, el medicamento pierde su eficacia contra las bacterias que normalmente combate. Indicaciones generales Beba suficiente lquido para mantener el pis (la orina) de color amarillo plido. Descanse todo lo que pueda. Retome sus actividades normales como se lo haya indicado el mdico. Pregntele al mdico qu actividades son seguras para usted. Cmo se previene? Para reducir el riesgo de contraer otra enfermedad viral: Lvese las manos frecuentemente con agua y jabn durante al menos 20 segundos. Use desinfectante para manos si no dispone de [place] y [place]. Evite tocarse la nariz, los ojos y la boca, sobre todo si no se lav las manos recientemente. Si un miembro de la familia tiene una infeccin viral, limpie todas las superficies de la casa que puedan haber estado en contacto con el virus. Use agua caliente y [place]. Tambin puede usar [employer] solucin de preparacin comercial que  contenga leja. Mantngase lejos de las personas enfermas con sntomas de una infeccin viral. No comparta objetos tales como cepillos de dientes y botellas de agua con Economist. Mantenga las vacunas al da. Esto incluye recibir la vacuna antigripal todos los [place]. Siga una dieta saludable y descanse lo suficiente. Comunquese con un mdico si: Tiene sntomas de una enfermedad viral que no desaparecen. Los sntomas regresan despus de haber desaparecido. Sus sntomas empeoran. Solicite ayuda de inmediato si: Tiene dificultad para respirar. Siente un dolor de cabeza intenso o rigidez en el cuello. Tiene vmitos o dolor abdominal intensos. Estos sntomas pueden Customer service manager. Solicite ayuda de inmediato. Llame al 911. No espere a ver si los sntomas desaparecen. No conduzca por sus propios medios [employer]. Esta informacin no tiene Theme park manager el consejo del mdico. Asegrese de hacerle al mdico cualquier pregunta que tenga. Document Revised: 10/08/2022 Document Reviewed: 06/30/2022 Elsevier Patient Education  2024 [employer].

## 2024-06-12 NOTE — Assessment & Plan Note (Signed)
 Clinically stable.  No red flag signs or symptoms. Unremarkable physical exam. Differential diagnosis discussed Running its course without complications Patient has history of Crohn's disease and recently started on Skyrizi  which affects immune system.  Contributing to present condition. Advised to rest and stay well-hydrated Diet and nutrition discussed Recommend blood work today Advised to contact the office if no better or worse during the next several days

## 2024-06-12 NOTE — Progress Notes (Signed)
 Mackenzie Floyd 53 y.o.   Chief Complaint  Patient presents with   Neck Pain    Patient here for neck pain. Says its sharp pain, she did felt a low grade fever but it is better. Started on the left side then now is moved to the right since 9/18. Patient states taking tylenol on Sunday for the pain and did help     HISTORY OF PRESENT ILLNESS: This is a 53 y.o. female complaining of 2-week history of flulike symptoms.  Slowly getting better.  Today about 50% better Has history of Crohn's disease.  Was started on Skyrizi  infusions early August.  Wondering if medication is related to her symptoms Had low-grade fever, general malaise, neck pain, generalized achiness.  Much improved today. No other associated symptoms. No other complaints or medical concerns today.  Neck Pain  Associated symptoms include a fever. Pertinent negatives include no chest pain or headaches.     Prior to Admission medications   Medication Sig Start Date End Date Taking? Authorizing Provider  pyridOXINE  (B-6) 50 MG tablet Take 1 tablet (50 mg total) by mouth daily. 12/06/23  Yes Comer, Lamar ORN, MD  Risankizumab -rzaa (SKYRIZI ) 180 MG/1.2ML SOCT Inject 180 mg into the skin every 8 (eight) weeks. 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   Abnormal abdominal CT scan 10/06/2023    Past Medical History:  Diagnosis Date   Crohn's disease (HCC)    TB lung, latent     Past Surgical History:  Procedure Laterality Date   CESAREAN SECTION N/A 1997 and 2008   Phreesia 03/04/2020   TONSILLECTOMY  1985    Social History   Socioeconomic History   Marital status: Married    Spouse name: Not on file   Number of children: 2   Years of education: Not on file   Highest education level: Bachelor's degree (e.g., BA, AB, BS)  Occupational History   Not on file  Tobacco Use    Smoking status: Never   Smokeless tobacco: Never  Vaping Use   Vaping status: Never Used  Substance and Sexual Activity   Alcohol use: Not Currently    Alcohol/week: 0.0 - 2.0 standard drinks of alcohol    Comment: socially beer and wine   Drug use: Never   Sexual activity: Not on file  Other Topics Concern   Not on file  Social History Narrative   Not on file   Social Drivers of Health   Financial Resource Strain: Low Risk  (10/05/2023)   Overall Financial Resource Strain (CARDIA)    Difficulty of Paying Living Expenses: Not very hard  Food Insecurity: No Food Insecurity (10/05/2023)   Hunger Vital Sign    Worried About Running Out of Food in the Last Year: Never true    Ran Out of Food in the Last Year: Never true  Transportation Needs: No Transportation Needs (10/05/2023)   PRAPARE - Administrator, Civil Service (Medical): No    Lack of Transportation (Non-Medical): No  Physical Activity: Sufficiently Active (10/05/2023)   Exercise Vital Sign    Days of Exercise per Week: 5 days    Minutes of Exercise per Session: 40 min  Stress: No Stress Concern Present (10/05/2023)   [employer] of Occupational Health - Occupational Stress Questionnaire    Feeling of Stress :  Not at all  Social Connections: Moderately Integrated (10/05/2023)   Social Connection and Isolation Panel    Frequency of Communication with Friends and Family: More than three times a week    Frequency of Social Gatherings with Friends and Family: Three times a week    Attends Religious Services: More than 4 times per year    Active Member of Clubs or Organizations: No    Attends Banker Meetings: Not on file    Marital Status: Married  Intimate Partner Violence: Unknown (12/18/2021)   Received from [facility]   HITS    Physically Hurt: Not on file    Insult or Talk Down To: Not on file    Threaten Physical Harm: Not on file    Scream or Curse: Not on file    Family History   Problem Relation Age of Onset   Varicose Veins Mother    Colon polyps Father 67   Stroke Father    Hypertension Father    Colon cancer Neg Hx    Esophageal cancer Neg Hx    Prostate cancer Neg Hx    Rectal cancer Neg Hx    Stomach cancer Neg Hx    Pancreatic cancer Neg Hx      Review of Systems  Constitutional:  Positive for fever and malaise/fatigue. Negative for chills.  HENT: Negative.  Negative for congestion and sore throat.   Respiratory: Negative.  Negative for cough and shortness of breath.   Cardiovascular: Negative.  Negative for chest pain and palpitations.  Gastrointestinal:  Negative for abdominal pain, diarrhea, nausea and vomiting.  Genitourinary: Negative.  Negative for dysuria and hematuria.  Musculoskeletal:  Positive for neck pain.  Skin: Negative.  Negative for rash.  Neurological: Negative.  Negative for dizziness and headaches.    Vitals:   06/12/24 0838  BP: 112/70  Pulse: 71  Temp: 98.6 F (37 C)  SpO2: 100%    Physical Exam Vitals reviewed.  Constitutional:      Appearance: Normal appearance.  HENT:     Head: Normocephalic.     Mouth/Throat:     Mouth: Mucous membranes are moist.     Pharynx: Oropharynx is clear.  Eyes:     Extraocular Movements: Extraocular movements intact.     Conjunctiva/sclera: Conjunctivae normal.     Pupils: Pupils are equal, round, and reactive to light.  Cardiovascular:     Rate and Rhythm: Normal rate and regular rhythm.     Pulses: Normal pulses.     Heart sounds: Normal heart sounds.  Pulmonary:     Effort: Pulmonary effort is normal.     Breath sounds: Normal breath sounds.  Musculoskeletal:     Cervical back: No tenderness.  Lymphadenopathy:     Cervical: No cervical adenopathy.  Skin:    General: Skin is warm and dry.     Capillary Refill: Capillary refill takes less than 2 seconds.  Neurological:     General: No focal deficit present.     Mental Status: She is alert and oriented to person,  place, and time.  Psychiatric:        Mood and Affect: Mood normal.        Behavior: Behavior normal.      ASSESSMENT & PLAN: Problem List Items Addressed This Visit       Other   Crohn's disease (HCC)   Stable chronic condition Recently started on Skyrizi       Viral illness - Primary  Clinically stable.  No red flag signs or symptoms. Unremarkable physical exam. Differential diagnosis discussed Running its course without complications Patient has history of Crohn's disease and recently started on Skyrizi  which affects immune system.  Contributing to present condition. Advised to rest and stay well-hydrated Diet and nutrition discussed Recommend blood work today Advised to contact the office if no better or worse during the next several days      Relevant Orders   CBC with Differential/Platelet   Comprehensive metabolic panel with GFR   Patient Instructions  Enfermedades virales en los adultos Viral Illness, Adult Los virus son microbios diminutos que entran en el organismo de [place] persona y causan enfermedades. Hay muchos tipos diferentes de virus. Y causan muchos tipos de enfermedades. Las enfermedades virales pueden ser leves o graves. Pueden afectar diferentes partes del cuerpo. Entre las afecciones a corto plazo causadas por virus, se incluyen los resfros, la gripe y los virus Runner, broadcasting/film/video. Entre las afecciones a largo plazo causadas por un virus, se incluyen el herpes, la culebrilla y la infeccin por el virus de inmunodeficiencia humana (VIH). Se han identificado unos pocos virus asociados con determinados tipos de cncer. Cules son las causas? Muchos tipos de virus pueden causar enfermedades. Los virus ingresan en las clulas del [place], se multiplican y provocan que las clulas infectadas funcionen de manera diferente o Brewer. Cuando estas clulas mueren, liberan ms virus. Cuando esto ocurre, aparecen los sntomas de la enfermedad, y el virus se disemina a  otras clulas. Si el virus domina el funcionamiento de la clula, puede hacer que esta se divida y prolifere de Psychologist, occupational. Esto ocurre cuando un virus causa cncer. Los diferentes virus ingresan al organismo de [employer]. Puede contraer un virus de la siguiente Widener: Al ingerir alimentos o beber agua que han entrado en contacto con el virus. Al inhalar gotitas que una persona infectada liber en el aire al toser o estornudar. Al tocar una superficie que tenga el virus y luego llevarse la mano a la boca, la nariz o los ojos. Al ser picado por un insecto o mordido por un animal que son portadores del virus. Al tener contacto sexual con ignacia persona infectada por el virus. Al tener contacto con sangre o lquidos que contienen el virus, ya sea a travs de un corte abierto o durante una transfusin. Si el virus ingresa al organismo, el sistema del cuerpo que combate las enfermedades (sistema inmunitario) Product/process development scientist. Puede correr un riesgo ms alto de tener una enfermedad viral si tiene el sistema inmunitario debilitado. Cules son los signos o sntomas? Los sntomas dependen del tipo de virus y de la ubicacin de las clulas en las que ingresa. Entre los sntomas se pueden incluir los siguientes: Con los virus del resfro y de la gripe: Lajune. Dolor de cabeza. Dolor de Advertising copywriter. Dolores musculares. Nariz tapada (congestin nasal). Tos. Con los virus estomacales (gastrointestinales): Lajune. Dolor en el abdomen. Nuseas o vmitos. Diarrea. Con los virus hepticos (hepatitis): Prdida del apetito. Cansancio. La piel o las partes blancas de los ojos se ponen amarillas (ictericia). Con los virus del cerebro y la mdula espinal: Lajune. Dolor de cabeza. Rigidez en el cuello. Nuseas y vmitos. Confusin o somnolencia. Con los virus de la piel: Public affairs consultant. Picazn. Erupcin cutnea. Con los virus de transmisin  sexual: Secrecin. Hinchazn. Enrojecimiento. Erupcin cutnea. Cmo se diagnostica? Esta afeccin se puede diagnosticar en funcin de una o ms de las siguientes evaluaciones: Los sntomas y antecedentes mdicos. Un examen fsico. Pruebas,  como, por ejemplo: Anlisis de [place]. Anlisis de [place] de mucosidad de los pulmones (muestra de esputo). Anlisis de [place] de materia fecal (heces). Anlisis de un hisopado de lquidos corporales o una llaga en la piel (lesin). Cmo se trata? Los virus pueden ser difciles de tratar porque se hospedan en las clulas. Los antibiticos no tratan los virus porque estos medicamentos no llegan al interior de las clulas. El tratamiento de una enfermedad viral puede incluir lo siguiente: Lawyer y beber mucho lquido. Medicamentos para tratar los sntomas. Estos pueden incluir medicamentos de venta libre para Chief Technology Officer y la [place], medicamentos para la tos o la congestin y medicamentos para la diarrea. Medicamentos antivirales. Estos medicamentos estn disponibles nicamente para ciertos tipos de virus. Algunas enfermedades virales pueden evitarse con vacunas. Un ejemplo frecuente es la vacuna antigripal. Siga estas indicaciones en su casa: Medicamentos Use los medicamentos de venta libre y los recetados solamente como se lo haya indicado el mdico. Si le recetaron un medicamento antiviral, tmelo como se lo haya indicado el mdico. No deje de tomar el antiviral aunque comience a sentirse mejor. Sepa cundo los antibiticos son necesarios y cundo no. Los antibiticos no combaten a los virus. Si tiene una infeccin viral y el mdico cree que tambin tiene una infeccin [place], o que est en riesgo de [place], tal vez le recete un antibitico. No solicite una receta para antibiticos si le han diagnosticado una enfermedad viral. Los antibiticos no harn que se cure ms rpidamente. Tomar antibiticos cuando no son necesarios puede  derivar en resistencia a los antibiticos. Cuando esto ocurre, el medicamento pierde su eficacia contra las bacterias que normalmente combate. Indicaciones generales Beba suficiente lquido para mantener el pis (la orina) de color amarillo plido. Descanse todo lo que pueda. Retome sus actividades normales como se lo haya indicado el mdico. Pregntele al mdico qu actividades son seguras para usted. Cmo se previene? Para reducir el riesgo de contraer otra enfermedad viral: Lvese las manos frecuentemente con agua y jabn durante al menos 20 segundos. Use desinfectante para manos si no dispone de [place] y [place]. Evite tocarse la nariz, los ojos y la boca, sobre todo si no se lav las manos recientemente. Si un miembro de la familia tiene una infeccin viral, limpie todas las superficies de la casa que puedan haber estado en contacto con el virus. Use agua caliente y jabn. Tambin puede usar una solucin de preparacin comercial que contenga leja. Mantngase lejos de las personas enfermas con sntomas de una infeccin viral. No comparta objetos tales como cepillos de dientes y botellas de agua con Economist. Mantenga las vacunas al da. Esto incluye recibir la vacuna antigripal todos los [employer]. Siga una dieta saludable y descanse lo suficiente. Comunquese con un mdico si: Tiene sntomas de una enfermedad viral que no desaparecen. Los sntomas regresan despus de haber desaparecido. Sus sntomas empeoran. Solicite ayuda de inmediato si: Tiene dificultad para respirar. Siente un dolor de cabeza intenso o rigidez en el cuello. Tiene vmitos o dolor abdominal intensos. Estos sntomas pueden Customer service manager. Solicite ayuda de inmediato. Llame al 911. No espere a ver si los sntomas desaparecen. No conduzca por sus propios medios [employer]. Esta informacin no tiene Theme park manager el consejo del mdico. Asegrese de hacerle al mdico cualquier pregunta que  tenga. Document Revised: 10/08/2022 Document Reviewed: 06/30/2022 Elsevier Patient Education  2024 Elsevier Inc.    Emil Schaumann, MD  Primary Care at [facility]

## 2024-06-14 ENCOUNTER — Ambulatory Visit (INDEPENDENT_AMBULATORY_CARE_PROVIDER_SITE_OTHER): Admitting: *Deleted

## 2024-06-14 VITALS — BP 113/80 | HR 66 | Temp 98.0°F | Resp 16 | Ht 65.0 in | Wt 134.2 lb

## 2024-06-14 DIAGNOSIS — K5000 Crohn's disease of small intestine without complications: Secondary | ICD-10-CM

## 2024-06-14 MED ORDER — SODIUM CHLORIDE 0.9 % IV SOLN
600.0000 mg | Freq: Once | INTRAVENOUS | Status: AC
  Administered 2024-06-14: 600 mg via INTRAVENOUS
  Filled 2024-06-14: qty 10

## 2024-06-14 NOTE — Progress Notes (Signed)
 Diagnosis: Crohn's Disease  Provider:  Mannam, Praveen MD  Procedure: IV Infusion  IV Type: Peripheral, IV Location: R Antecubital  Skyrizi  (risankizumab -rzaa), Dose: 600 mg  Infusion Start Time: 0911 am  Infusion Stop Time: 1020 am  Post Infusion IV Care: Observation period completed and Peripheral IV Discontinued  Discharge: Condition: Good, Destination: Home . AVS Declined  Performed by:  Trudy Lamarr LABOR, RN

## 2024-08-17 ENCOUNTER — Telehealth: Payer: Self-pay | Admitting: Gastroenterology

## 2024-08-17 NOTE — Telephone Encounter (Signed)
 Inbound call from patient stating she would like a call back from the nurse to discuss a  concern she has. Please advise.

## 2024-08-17 NOTE — Telephone Encounter (Signed)
 Pt states she broke out in a rash on her stomach and her right side/arm. Pt states she has not started any new soaps/detergents/ or lotions. She states the area looks like a hive and it did itch for a little bit but she put some apple cider vinegar on it and it stopped the itching. Discussed with pt that she should contact her PCP regarding the rash. She has been on skyrizi  since August so it should not  be from the skyrizi . Pt verbalized understanding.

## 2024-09-10 ENCOUNTER — Ambulatory Visit: Admitting: Gastroenterology

## 2024-09-10 ENCOUNTER — Other Ambulatory Visit

## 2024-09-10 ENCOUNTER — Encounter: Payer: Self-pay | Admitting: Gastroenterology

## 2024-09-10 VITALS — BP 100/58 | HR 63 | Ht 65.0 in | Wt 137.5 lb

## 2024-09-10 DIAGNOSIS — T148XXA Other injury of unspecified body region, initial encounter: Secondary | ICD-10-CM

## 2024-09-10 DIAGNOSIS — K5000 Crohn's disease of small intestine without complications: Secondary | ICD-10-CM

## 2024-09-10 DIAGNOSIS — R21 Rash and other nonspecific skin eruption: Secondary | ICD-10-CM

## 2024-09-10 DIAGNOSIS — R233 Spontaneous ecchymoses: Secondary | ICD-10-CM

## 2024-09-10 DIAGNOSIS — R1030 Lower abdominal pain, unspecified: Secondary | ICD-10-CM

## 2024-09-10 DIAGNOSIS — Z227 Latent tuberculosis: Secondary | ICD-10-CM | POA: Diagnosis not present

## 2024-09-10 DIAGNOSIS — R7612 Nonspecific reaction to cell mediated immunity measurement of gamma interferon antigen response without active tuberculosis: Secondary | ICD-10-CM

## 2024-09-10 LAB — COMPREHENSIVE METABOLIC PANEL WITH GFR
ALT: 14 U/L (ref 3–35)
AST: 21 U/L (ref 5–37)
Albumin: 4.2 g/dL (ref 3.5–5.2)
Alkaline Phosphatase: 74 U/L (ref 39–117)
BUN: 18 mg/dL (ref 6–23)
CO2: 29 meq/L (ref 19–32)
Calcium: 8.8 mg/dL (ref 8.4–10.5)
Chloride: 102 meq/L (ref 96–112)
Creatinine, Ser: 0.75 mg/dL (ref 0.40–1.20)
GFR: 90.92 mL/min
Glucose, Bld: 79 mg/dL (ref 70–99)
Potassium: 4 meq/L (ref 3.5–5.1)
Sodium: 137 meq/L (ref 135–145)
Total Bilirubin: 1.3 mg/dL — ABNORMAL HIGH (ref 0.2–1.2)
Total Protein: 6.6 g/dL (ref 6.0–8.3)

## 2024-09-10 LAB — CBC WITH DIFFERENTIAL/PLATELET
Basophils Absolute: 0 K/uL (ref 0.0–0.1)
Basophils Relative: 1.1 % (ref 0.0–3.0)
Eosinophils Absolute: 0.2 K/uL (ref 0.0–0.7)
Eosinophils Relative: 6.8 % — ABNORMAL HIGH (ref 0.0–5.0)
HCT: 39.9 % (ref 36.0–46.0)
Hemoglobin: 13.1 g/dL (ref 12.0–15.0)
Lymphocytes Relative: 34.4 % (ref 12.0–46.0)
Lymphs Abs: 1 K/uL (ref 0.7–4.0)
MCHC: 32.9 g/dL (ref 30.0–36.0)
MCV: 84 fl (ref 78.0–100.0)
Monocytes Absolute: 0.2 K/uL (ref 0.1–1.0)
Monocytes Relative: 8.9 % (ref 3.0–12.0)
Neutro Abs: 1.4 K/uL (ref 1.4–7.7)
Neutrophils Relative %: 48.8 % (ref 43.0–77.0)
Platelets: 112 K/uL — ABNORMAL LOW (ref 150.0–400.0)
RBC: 4.75 Mil/uL (ref 3.87–5.11)
RDW: 14.9 % (ref 11.5–15.5)
WBC: 2.8 K/uL — ABNORMAL LOW (ref 4.0–10.5)

## 2024-09-10 LAB — PROTIME-INR
INR: 1.1 ratio — ABNORMAL HIGH (ref 0.8–1.0)
Prothrombin Time: 11.6 s (ref 9.6–13.1)

## 2024-09-10 NOTE — Progress Notes (Signed)
 "  Chief Complaint: Crohn's follow up Primary GI MD: Dr. Leigh  HPI: Discussed the use of AI scribe software for clinical note transcription with the patient, who gave verbal consent to proceed.  History of Present Illness  Mackenzie Floyd is a 53 year old female with Crohn's disease who presents for follow-up of disease management.  Crohn's disease with documented ileal inflammation was previously managed by Dr. Leigh. Skyrizi  therapy was initiated with an infusion in August 2025, followed by self-administered injections in October and December 2025. During the first month of Skyrizi , constipation, abdominal discomfort described as inflammation, and a lot of blooding occurred, but these symptoms improved over seven weeks. Currently, bowel movements are regular without constipation, blood in stool, or abdominal pain. Bowel habits have improved with dietary changes, specifically kiwi consumption.  Concerns regarding continued access to Skyrizi  have arisen due to upcoming changes in insurance coverage.  A pruritic rash developed in mid-November 2025, one to two weeks after the first injection, affecting legs, arms. The rash is itchy, especially when touched, and some lesions are growing. No history of psoriasis and no dermatology evaluation to date. No worsening of the rash after the most recent injection.  Increased bruising has occurred, with new lesions appearing suddenly and without clear trauma, most recently noted the Friday prior to the visit. She has experienced intermittent bruising throughout her life and notes a prior low platelet count on laboratory testing.  Abnormal uterine bleeding is reported, with two menstrual periods in November and December 2025, the latter described as heavy and ongoing. Previously, periods occurred every six months, but frequency has recently increased. She has not yet consulted her gynecologist regarding this issue.    PREVIOUS GI WORKUP    Colonoscopy 01/20/22 for colon cancer screening. - One diminutive polyp in the sigmoid colon, removed with a cold snare. Resected and retrieved.  - Tortuous colon.  - The examination was otherwise normal. Path:  Diagnosis Surgical [P], colon, sigmoid, polyp (1) - HYPERPLASTIC POLYP (S).   10/01/23 CT ABD/pelvis w/ contrast IMPRESSION:  1. Mural thickening and mucosal hyperenhancement in the distal ileum  and terminal ileum with multiple segmental regions of luminal  narrowing. Findings are concerning for inflammatory bowel disease  such as Crohn's disease.  2. There is dilatation of small-bowel loops throughout the distal  jejunum and ileum with multiple air-fluid levels, as well as near  complete decompression of the colon, concerning for a developing  small-bowel obstruction.  3. Additional incidental findings, as above.     MRE 11/08/23: 1. Short segment wall thickening and mucosal hyperenhancement of the  terminal ileum, affecting a segment approximately 7 cm in length  from the ileocecal valve. 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  2. Previously seen distal small bowel obstruction is resolved as are  additional thickened and hyperenhancing loops of distal ileum seen  in the right lower quadrant on prior examination.  3. No evidence of overt stricture, fistula, or abscess.  4. Trace free fluid in the pelvis, likely reactive.        Colonoscopy 12/21/23: - The perianal and digital rectal examinations were normal. - Diffuse moderate inflammation characterized by congestion (edema), erosions, erythema, friability and granularity was found in the terminal ileum. Biopsies were taken with a cold forceps for histology and mycobacterial staining. - A 5 mm polyp was found in the descending colon. The polyp was flat. The polyp was removed with a cold snare. Resection and retrieval were complete. -  Localized ? mild inflammation characterized by altered vascularity and  granularity was found in the very distal rectum. Biopsies were taken with a cold forceps for histology. - The colon was extremely tortuous. - Internal hemorrhoids were found during retroflexion. The hemorrhoids were small. - The exam was otherwise without abnormality.   FINAL DIAGNOSIS       1. Surgical [P], small bowel, ileum :      - MILDLY ACTIVE CHRONIC ILEITIS, CONSISTENT WITH PATIENT'S CLINICAL HISTORY OF      CROHN'S DISEASE      - NEGATIVE FOR GRANULOMAS OR DYSPLASIA       2. Surgical [P], small bowel, ileum :      - MILDLY ACTIVE CHRONIC ILEITIS, CONSISTENT WITH PATIENT'S CLINICAL HISTORY OF      CROHN'S DISEASE      - NEGATIVE FOR GRANULOMAS OR DYSPLASIA       3. Surgical [P], colon, descending, polyp (1) :      - POLYPOID COLONIC MUCOSA WITH MILD HYPERPLASTIC CHANGES      - NEGATIVE FOR DYSPLASIA      - MULTIPLE STEP SECTIONS WERE EXAMINED       4. Surgical [P], colon, rectum bxs :      - RECTAL MUCOSA WITH NO SPECIFIC HISTOPATHOLOGIC CHANGES      - NEGATIVE FOR ACUTE INFLAMMATION, FEATURES OF CHRONICITY, GRANULOMAS OR      DYSPLASIA    ADDENDUM : AFB stains are negative for acid fast bacilli.   Past Medical History:  Diagnosis Date   Crohn's disease (HCC)    TB lung, latent     Past Surgical History:  Procedure Laterality Date   CESAREAN SECTION N/A 1997 and 2008   [employer] 03/04/2020   COLONOSCOPY     TONSILLECTOMY  1985    Current Outpatient Medications  Medication Sig Dispense Refill   Biotin 1 MG CAPS Take by mouth.     magnesium (MAGTAB) 84 MG ( ) TBCR SR tablet Take 84 mg by mouth.     Risankizumab -rzaa (SKYRIZI ) 180 MG/1.2ML SOCT Inject 180 mg into the skin every 8 (eight) weeks. 1.2 mL 6   vitamin B-12 (CYANOCOBALAMIN ) 100 MCG tablet Take 100 mcg by mouth daily.     pyridOXINE  (B-6) 50 MG tablet Take 1 tablet (50 mg total) by mouth daily. (Patient not taking: Reported on 09/10/2024) 30 tablet 8   No current facility-administered medications for  this visit.    Allergies as of 09/10/2024   (No Known Allergies)    Family History  Problem Relation Age of Onset   Varicose Veins Mother    Colon polyps Father 11   Stroke Father    Hypertension Father    Colon cancer Neg Hx    Esophageal cancer Neg Hx    Prostate cancer Neg Hx    Rectal cancer Neg Hx    Stomach cancer Neg Hx    Pancreatic cancer Neg Hx     Social History   Socioeconomic History   Marital status: Married    Spouse name: Not on file   Number of children: 2   Years of education: Not on file   Highest education level: Bachelor's degree (e.g., BA, AB, BS)  Occupational History   Not on file  Tobacco Use   Smoking status: Never   Smokeless tobacco: Never  Vaping Use   Vaping status: Never Used  Substance and Sexual Activity   Alcohol use: Not Currently    Alcohol/week: 0.0 - 2.0  standard drinks of alcohol    Comment: socially beer and wine   Drug use: Never   Sexual activity: Not on file  Other Topics Concern   Not on file  Social History Narrative   Not on file   Social Drivers of Health   Tobacco Use: Low Risk (09/10/2024)   Patient History    Smoking Tobacco Use: Never    Smokeless Tobacco Use: Never    Passive Exposure: Not on file  Financial Resource Strain: Low Risk (10/05/2023)   Overall Financial Resource Strain (CARDIA)    Difficulty of Paying Living Expenses: Not very hard  Food Insecurity: No Food Insecurity (10/05/2023)   Hunger Vital Sign    Worried About Running Out of Food in the Last Year: Never true    Ran Out of Food in the Last Year: Never true  Transportation Needs: No Transportation Needs (10/05/2023)   PRAPARE - Administrator, Civil Service (Medical): No    Lack of Transportation (Non-Medical): No  Physical Activity: Sufficiently Active (10/05/2023)   Exercise Vital Sign    Days of Exercise per Week: 5 days    Minutes of Exercise per Session: 40 min  Stress: No Stress Concern Present (10/05/2023)   [employer] - Occupational Stress Questionnaire    Feeling of Stress : Not at all  Social Connections: Moderately Integrated (10/05/2023)   Social Connection and Isolation Panel    Frequency of Communication with Friends and Family: More than three times a week    Frequency of Social Gatherings with Friends and Family: Three times a week    Attends Religious Services: More than 4 times per year    Active Member of Clubs or Organizations: No    Attends Banker Meetings: Not on file    Marital Status: Married  Catering Manager Violence: Not on file  Depression (PHQ2-9): Low Risk (06/12/2024)   Depression (PHQ2-9)    PHQ-2 Score: 0  Alcohol Screen: Low Risk (10/05/2023)   Alcohol Screen    Last Alcohol Screening Score (AUDIT): 1  Housing: Low Risk (10/05/2023)   Housing Stability Vital Sign    Unable to Pay for Housing in the Last Year: No    Number of Times Moved in the Last Year: 0    Homeless in the Last Year: No  Utilities: Not on file  Health Literacy: Not on file    Review of Systems:    Constitutional: No weight loss, fever, chills, weakness or fatigue HEENT: Eyes: No change in vision               Ears, Nose, Throat:  No change in hearing or congestion Skin: No rash or itching Cardiovascular: No chest pain, chest pressure or palpitations   Respiratory: No SOB or cough Gastrointestinal: See HPI and otherwise negative Genitourinary: No dysuria or change in urinary frequency Neurological: No headache, dizziness or syncope Musculoskeletal: No new muscle or joint pain Hematologic: No bleeding or bruising Psychiatric: No history of depression or anxiety    Physical Exam:  Vital signs: BP (!) 100/58   Pulse 63   Ht 5' 5 (1.651 m)   Wt 137 lb 8 oz (62.4 kg)   BMI 22.88 kg/m   Constitutional: NAD, alert and cooperative Head:  Normocephalic and atraumatic. Eyes:   PEERL, EOMI. No icterus. Conjunctiva pink. Respiratory: Respirations even  and unlabored. Lungs clear to auscultation bilaterally.   No wheezes, crackles, or rhonchi.  Cardiovascular:  Regular rate and rhythm. No peripheral edema, cyanosis or pallor.  Gastrointestinal:  Soft, nondistended, nontender. No rebound or guarding. Normal bowel sounds. No appreciable masses or hepatomegaly. Rectal:  Declines Msk:  Symmetrical without gross deformities. Without edema, no deformity or joint abnormality.  Neurologic:  Alert and  oriented x4;  grossly normal neurologically.  Skin:   erythematous (red) plaques with silvery-white scales on elbows Psychiatric: Oriented to person, place and time. Demonstrates good judgement and reason without abnormal affect or behaviors.  Physical Exam    RELEVANT LABS AND IMAGING: CBC    Component Value Date/Time   WBC 2.8 (L) 09/10/2024 0909   RBC 4.75 09/10/2024 0909   HGB 13.1 09/10/2024 0909   HCT 39.9 09/10/2024 0909   PLT 112.0 (L) 09/10/2024 0909   MCV 84.0 09/10/2024 0909   MCH 26.9 (L) 03/07/2024 0851   MCHC 32.9 09/10/2024 0909   RDW 14.9 09/10/2024 0909   LYMPHSABS 1.0 09/10/2024 0909   MONOABS 0.2 09/10/2024 0909   EOSABS 0.2 09/10/2024 0909   BASOSABS 0.0 09/10/2024 0909    CMP     Component Value Date/Time   NA 137 09/10/2024 0909   K 4.0 09/10/2024 0909   CL 102 09/10/2024 0909   CO2 29 09/10/2024 0909   GLUCOSE 79 09/10/2024 0909   BUN 18 09/10/2024 0909   CREATININE 0.75 09/10/2024 0909   CREATININE 0.81 05/31/2024 0940   CALCIUM 8.8 09/10/2024 0909   PROT 6.6 09/10/2024 0909   PROT 7.3 07/07/2022 0914   ALBUMIN 4.2 09/10/2024 0909   ALBUMIN 4.6 07/07/2022 0914   AST 21 09/10/2024 0909   ALT 14 09/10/2024 0909   ALKPHOS 74 09/10/2024 0909   BILITOT 1.3 (H) 09/10/2024 0909   BILITOT 1.0 07/07/2022 0914     Assessment/Plan:   Crohn's Disease Ileal Crohn's Dx January 2025 with CTAP and MR enterography with follow up colonoscopy showing ileitis. Previously on budesonide  with continued symptoms.  Started Skyrizi  04/2024. Last dose 12/25. Resolution of her abdominal pain on skyrizi . Last fecal cal was 126. Patient wants to come off medication since she is feeling good. Extensive discussion with patient about Crohn's and long term medication requirement to prevent worsening disease -- CBC/CMP -- fecal calprotectin -- Extensive discussion with patient about importance of long-term maintenance medication in a patient with Crohn's disease to prevent future issues such as fistula, stricture, abscess - Continue Skyrizi  -- recommend annual skin exams as well as opthalmic -- UTD on flu vaccine - Follow-up 6 months (or sooner if symptoms arise)  Easy bruising Reports easy bruising her whole life and has been previously seen for this but it is still a concern for her. PT/INR normal 03/2024 with platelets 125k -- recheck PT/INR -- reassurance  Skin rash Patient notes skin rash on extensor surfaces that resembles psoriasis on physical exam. Feels this is related to skyrizi , it began 07/2024. 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

## 2024-09-10 NOTE — Progress Notes (Signed)
 Agree with assessment and plan as outlined.

## 2024-09-10 NOTE — Patient Instructions (Addendum)
 Your provider has requested that you go to the basement level for lab work before leaving today. Press B on the elevator. The lab is located at the first door on the left as you exit the elevator.   We placed a referral to Dermatology. Their office will call you to schedule an appointment. If you haven't heard from their clinic within 7 business days please call them at [phone].

## 2024-09-11 ENCOUNTER — Other Ambulatory Visit (HOSPITAL_COMMUNITY): Payer: Self-pay

## 2024-09-11 ENCOUNTER — Ambulatory Visit: Payer: Self-pay | Admitting: Gastroenterology

## 2024-09-12 ENCOUNTER — Other Ambulatory Visit (HOSPITAL_COMMUNITY): Payer: Self-pay

## 2024-09-12 ENCOUNTER — Telehealth: Payer: Self-pay | Admitting: *Deleted

## 2024-09-12 NOTE — Telephone Encounter (Signed)
 PA team-  Can you all help with this? New insurance info is scanned into EPIC. Thank you for your help!

## 2024-09-12 NOTE — Telephone Encounter (Signed)
-----   Message from [facility] Garwood S sent at 09/10/2024  8:59 AM EST ----- Regarding: Skyrizi  Good morning ladies! This patient has new insurance and is wondering if her Skyrizi  will be covered. Is there any chance yall could help out with this, please? Thank you.

## 2024-09-13 LAB — CALPROTECTIN, FECAL: Calprotectin, Fecal: 68 ug/g (ref 0–120)

## 2024-09-14 ENCOUNTER — Other Ambulatory Visit (HOSPITAL_COMMUNITY): Payer: Self-pay

## 2024-09-14 ENCOUNTER — Telehealth: Payer: Self-pay

## 2024-09-14 NOTE — Telephone Encounter (Signed)
 Pharmacy Patient Advocate Encounter   Received notification from Pt Calls Messages that prior authorization for Skyrizi  180MG /1.2ML (150MG /ML) single-dose prefilled cartridge with on-body injector is required/requested.   Insurance verification completed.   The patient is insured through [id number].   Per test claim: The current 56 day co-pay is, $xxx.  No PA needed at this time. This test claim was processed through [facility]- copay amounts may vary at other pharmacies due to pharmacy/plan contracts, or as the patient moves through the different stages of their insurance plan.    Tried to process prior authorization due to full price showing as a paid claim. Authorization was cancelled as the insurance reads it is available without one. I contacted the insurance company to ask why it shows as a discount program and no authorization required. Rep I spoke with told me that Skyrizi  does not require prior authorization and that it is a Tier 1 medication. I inquired about the cost and when she processes a claim, it shows her an estimated charge of $0.00. She did not find where patient is required to fill at a particular pharmacy.

## 2024-09-14 NOTE — Telephone Encounter (Signed)
 PA request has been Received. New Encounter has been or will be created for follow up. For additional info see Pharmacy Prior Auth telephone encounter from 09-14-2024.

## 2024-10-05 ENCOUNTER — Other Ambulatory Visit (HOSPITAL_COMMUNITY): Payer: Self-pay

## 2024-10-05 ENCOUNTER — Telehealth: Payer: Self-pay | Admitting: Gastroenterology

## 2024-10-05 NOTE — Telephone Encounter (Signed)
 Inbound call from patient requesting to know if she needs to have another prior authorization for her Skyrizi . Patient insurance was updated. Please advise.

## 2024-10-05 NOTE — Telephone Encounter (Signed)
 Patient is advised that our PA team requested PA 09/14/24 through her new insurance and was advised no PA needed. Patient should just provide her pharmacy with updated insurance information and request Skyrizi  refill.

## 2024-10-05 NOTE — Telephone Encounter (Signed)
 Has patient changed insurance since beginning of January? We tried submitting on 01-02 you can see the encounter for further information.

## 2024-10-19 NOTE — Telephone Encounter (Signed)
 [facility] pharmacy called stating that pt's insurance does not cover Skyrizi  and would like alternative. Please advise. Thank you

## 2025-05-14 ENCOUNTER — Ambulatory Visit: Admitting: Physician Assistant
